# Patient Record
Sex: Male | Born: 1975 | Race: Black or African American | Hispanic: No | Marital: Single | Smoking: Never smoker
Health system: Southern US, Community
[De-identification: ages and names within clinical notes are randomized; demographics above are authoritative.]

## PROBLEM LIST (undated history)

## (undated) DIAGNOSIS — F70 Mild intellectual disabilities: Secondary | ICD-10-CM

## (undated) DIAGNOSIS — D72819 Decreased white blood cell count, unspecified: Secondary | ICD-10-CM

## (undated) DIAGNOSIS — H9190 Unspecified hearing loss, unspecified ear: Secondary | ICD-10-CM

## (undated) HISTORY — DX: Decreased white blood cell count, unspecified: D72.819

## (undated) HISTORY — DX: Mild intellectual disabilities: F70

## (undated) HISTORY — PX: OTHER SURGICAL HISTORY: SHX169

## (undated) HISTORY — DX: Unspecified hearing loss, unspecified ear: H91.90

## (undated) HISTORY — PX: TONSILLECTOMY: SUR1361

---

## 1999-02-28 ENCOUNTER — Emergency Department (HOSPITAL_COMMUNITY): Admission: EM | Admit: 1999-02-28 | Discharge: 1999-02-28 | Payer: Self-pay | Admitting: Emergency Medicine

## 1999-09-05 ENCOUNTER — Ambulatory Visit (HOSPITAL_BASED_OUTPATIENT_CLINIC_OR_DEPARTMENT_OTHER): Admission: RE | Admit: 1999-09-05 | Discharge: 1999-09-05 | Payer: Self-pay | Admitting: Dentistry

## 2002-11-22 ENCOUNTER — Encounter: Admission: RE | Admit: 2002-11-22 | Discharge: 2002-11-22 | Payer: Self-pay | Admitting: Family Medicine

## 2002-11-22 ENCOUNTER — Encounter: Payer: Self-pay | Admitting: Family Medicine

## 2010-05-09 ENCOUNTER — Ambulatory Visit
Admission: RE | Admit: 2010-05-09 | Discharge: 2010-05-09 | Payer: Self-pay | Source: Home / Self Care | Attending: Internal Medicine | Admitting: Internal Medicine

## 2010-05-09 DIAGNOSIS — F70 Mild intellectual disabilities: Secondary | ICD-10-CM

## 2010-05-09 HISTORY — DX: Mild intellectual disabilities: F70

## 2010-05-29 NOTE — Assessment & Plan Note (Signed)
Summary: NEW MEDICARE PT---PKG---#---STC   Vital Signs:  Patient profile:   35 year old male Height:      69 inches Weight:      210 pounds BMI:     31.12 O2 Sat:      97 % on Room air Temp:     97.4 degrees F oral Pulse rate:   85 / minute Pulse rhythm:   regular Resp:     16 per minute BP sitting:   100 / 60  (left arm) Cuff size:   large  Vitals Entered By: Rock Nephew CMA (May 09, 2010 4:19 PM)  Nutrition Counseling: Patient's BMI is greater than 25 and therefore counseled on weight management options.  O2 Flow:  Room air CC: New to establish Is Patient Diabetic? No Pain Assessment Patient in pain? no       Does patient need assistance? Functional Status Self care Ambulation Normal   Primary Care Provider:  Etta Grandchild MD  CC:  New to establish.  History of Present Illness: New to me this is a deaf male who needs forms completed so he can use public transportation with assistance. His father is with him today and tells me that he is unable to negotiate public places without assistance due to deafness and cognitive deficits that have been present since childhood. He was in special ed when he was in school.  Preventive Screening-Counseling & Management  Alcohol-Tobacco     Alcohol drinks/day: 0     Alcohol Counseling: not indicated; patient does not drink     Feels need to cut down: no     Smoking Status: never     Tobacco Counseling: not indicated; no tobacco use  Caffeine-Diet-Exercise     Does Patient Exercise: no  Hep-HIV-STD-Contraception     Hepatitis Risk: no risk noted     HIV Risk: no risk noted     STD Risk: no risk noted      Sexual History:  not active.        Drug Use:  no.    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    Physical Exam  General:  alert, well-developed, well-nourished, well-hydrated, appropriate dress, cooperative to examination, good hygiene, and overweight-appearing.   Head:  he has a prominent forehead and a very prominent chin with a  severe underbite. Eyes:  vision grossly intact, pupils equal, pupils round, and pupils reactive to light.   Ears:  R ear normal and L ear normal.   Nose:  External nasal examination shows no deformity or inflammation. Nasal mucosa are pink and moist without lesions or exudates. Mouth:  good dentition, pharynx pink and moist, no erythema, no exudates, no posterior lymphoid  hypertrophy, no postnasal drip, no pharyngeal crowing, and no erosions.   Neck:  supple, full ROM, no masses, no thyromegaly, no thyroid nodules or tenderness, no JVD, normal carotid upstroke, no carotid bruits, no cervical lymphadenopathy, and no neck tenderness.   Lungs:  normal respiratory effort, no intercostal retractions, no accessory muscle use, normal breath sounds, no dullness, no fremitus, no crackles, and no wheezes.   Heart:  normal rate, regular rhythm, no murmur, no gallop, no rub, and no JVD.   Abdomen:  soft, non-tender, normal bowel sounds, no distention, no masses, no guarding, no rigidity, no rebound tenderness, no  abdominal hernia, no inguinal hernia, no hepatomegaly, and no splenomegaly.   Msk:  normal ROM, no joint tenderness, no joint swelling, no joint warmth, no redness over joints, no joint deformities, no joint instability, and no crepitation.   Pulses:  R and L carotid,radial,femoral,dorsalis pedis and posterior tibial pulses are full and equal bilaterally Extremities:  No clubbing, cyanosis, edema, or deformity noted with normal full range of motion of all joints.   Neurologic:  No cranial nerve deficits noted. Station and gait are normal. Plantar reflexes are down-going bilaterally. DTRs are symmetrical throughout. Sensory, motor and coordinative functions appear intact. Skin:  Intact without suspicious lesions or rashes Cervical Nodes:  no anterior cervical adenopathy and no posterior cervical adenopathy.   Axillary Nodes:  no R axillary adenopathy and no L axillary adenopathy.   Inguinal Nodes:  no R inguinal adenopathy and no L inguinal adenopathy.   Psych:  normally interactive, good eye contact, not anxious appearing, and not depressed appearing.     Impression & Recommendations:  Problem # 1:  MENTAL RETARDATION, MILD (ICD-317) Assessment New forms were completed as requested  Patient Instructions: 1)  Please schedule a follow-up appointment in 4 months. 2)  It is important that you exercise regularly at least 20 minutes 5 times a week. If you develop chest pain, have severe difficulty breathing, or feel very tired , stop exercising immediately and seek medical attention. 3)  You need to lose weight. Consider a lower calorie diet and regular exercise.    Orders Added: 1)  New Patient Level III [99203]  Appended Document: NEW MEDICARE PT---PKG---#---STC    Clinical Lists Changes  Orders: Added new Service order of Tdap => 63yrs IM ([address]) - Signed Added new Service order of Admin 1st Vaccine (60454) - Signed Observations: Added new observation of TD BOOST VIS: 03/14/08  version given May 12, 2010. (05/12/2010 8:57) Added new observation of TD BOOSTERLO: [id number] (05/12/2010 8:57) Added new observation of TD BOOST EXP: 02/14/2012 (05/12/2010 8:57) Added new observation of TD BOOSTERBY: Lakisha Archie CMA (05/12/2010 8:57) Added new observation of TD BOOSTERRT: IM (05/12/2010 8:57) Added new observation of TDBOOSTERDSE: 0.5 ml (05/12/2010 8:57) Added new observation of TD BOOSTERMF: GlaxoSmithKline (05/12/2010 8:57) Added new observation of TD BOOST SIT: right deltoid (05/12/2010 8:57) Added new observation of TD BOOSTER: Tdap (05/12/2010 8:57)       Immunizations Administered:  Tetanus Vaccine:    Vaccine Type: Tdap    Site: right deltoid    Mfr: GlaxoSmithKline    Dose: 0.5 ml    Route: IM    Given by: Rock Nephew CMA    Exp. Date: 02/14/2012    Lot #: [id number]    VIS given: 03/14/08 version given May 12, 2010.

## 2010-06-19 ENCOUNTER — Encounter: Payer: Self-pay | Admitting: Internal Medicine

## 2010-06-19 ENCOUNTER — Telehealth: Payer: Self-pay | Admitting: Internal Medicine

## 2010-06-24 NOTE — Letter (Signed)
Summary: Generic Letter  [facility]  [address] [place], [place] [address]   Phone: [phone]  Fax: [phone]       06/19/2010  JOVANNIE ULIBARRI [address] [place], [place]  [address]  Dear Mr. GODEK,   Please excuse the above individual from jury duty due to deafness and cognitive deficits.        Sincerely,   Sanda Linger MD

## 2010-06-24 NOTE — Progress Notes (Signed)
Summary: REQ FOR LETTER  Phone Note Call from Patient Call back at Home Phone [phone]   Summary of Call: Pt's father called req a letter from MD to relieve pt from judy duty.  Initial call taken by: Lamar Sprinkles, CMA,  June 19, 2010 2:20 PM  Follow-up for Phone Call        Letter ready, need to call father to inform.  Follow-up by: Lamar Sprinkles, CMA,  June 19, 2010 4:40 PM  Additional Follow-up for Phone Call Additional follow up Details #1::        mother informed Additional Follow-up by: Lamar Sprinkles, CMA,  June 19, 2010 5:41 PM

## 2010-09-12 NOTE — Op Note (Signed)
[place]. [facility]  Patient:    Eugene Mccoy, Eugene Mccoy                      MRN: [id number] Adm. Date:  [id number] Disc. Date: [id number] Attending:  Juluis Pitch Hincken                           Operative Report  Plumer was brought into the operating room in the supine position.  General anesthesia induced and nasal intubation used without complications.  Local anesthesia administered was 108 mg Carbocaine with 0.270 mg ______.  Full mouth debridement performed with Cavitron and hand instruments.  Teeth #14, 15, 19, 30, and 31 deemed to be hopeless due to bone loss and mobility and were removed with forceps without complications.  A total of six 4-0 chromic gut sutures were used to close the five extraction sites.  No overall complications.  Postoperative instructions, oral and written, were given to the patients mother.  Postoperative care to be handled in Dr. Allayne Gitelman. [place] office in two to three weeks. DD:  09/05/99 TD:  09/08/99 Job: [id number]

## 2010-11-06 ENCOUNTER — Encounter: Payer: Self-pay | Admitting: Internal Medicine

## 2010-11-07 ENCOUNTER — Ambulatory Visit: Payer: Self-pay | Admitting: Internal Medicine

## 2010-11-07 ENCOUNTER — Encounter: Payer: Self-pay | Admitting: Internal Medicine

## 2010-11-07 ENCOUNTER — Ambulatory Visit (INDEPENDENT_AMBULATORY_CARE_PROVIDER_SITE_OTHER): Payer: Medicare Other | Admitting: Internal Medicine

## 2010-11-07 ENCOUNTER — Other Ambulatory Visit (INDEPENDENT_AMBULATORY_CARE_PROVIDER_SITE_OTHER): Payer: Medicare Other

## 2010-11-07 DIAGNOSIS — Z0000 Encounter for general adult medical examination without abnormal findings: Secondary | ICD-10-CM

## 2010-11-07 DIAGNOSIS — H9190 Unspecified hearing loss, unspecified ear: Secondary | ICD-10-CM

## 2010-11-07 DIAGNOSIS — D61818 Other pancytopenia: Secondary | ICD-10-CM

## 2010-11-07 LAB — COMPREHENSIVE METABOLIC PANEL
ALT: 35 U/L (ref 0–53)
AST: 26 U/L (ref 0–37)
Albumin: 4.7 g/dL (ref 3.5–5.2)
Alkaline Phosphatase: 59 U/L (ref 39–117)
GFR: 109.43 mL/min (ref >60.00)
Glucose, Bld: 90 mg/dL (ref 70–99)
Potassium: 3.8 mEq/L (ref 3.5–5.1)

## 2010-11-07 LAB — LIPID PANEL
HDL: 53.8 mg/dL (ref >39.00)
Triglycerides: 96 mg/dL (ref 0.0–149.0)
VLDL: 19.2 mg/dL (ref 0.0–40.0)

## 2010-11-07 LAB — CBC WITH DIFFERENTIAL/PLATELET
Basophils Absolute: 0 10*3/uL (ref 0.0–0.1)
Basophils Relative: 0.4 % (ref 0.0–3.0)
Eosinophils Absolute: 0 10*3/uL (ref 0.0–0.7)
HCT: 35.8 % — ABNORMAL LOW (ref 39.0–52.0)
Hemoglobin: 12.8 g/dL — ABNORMAL LOW (ref 13.0–17.0)
Lymphs Abs: 1.3 10*3/uL (ref 0.7–4.0)
MCV: 103.3 fl — ABNORMAL HIGH (ref 78.0–100.0)
Monocytes Relative: 9.1 % (ref 3.0–12.0)
Neutro Abs: 0.6 10*3/uL — ABNORMAL LOW (ref 1.4–7.7)
Neutrophils Relative %: 27 % — ABNORMAL LOW (ref 43.0–77.0)
RBC: 3.46 Mil/uL — ABNORMAL LOW (ref 4.22–5.81)
RDW: 14 % (ref 11.5–14.6)
WBC: 2.1 10*3/uL — ABNORMAL LOW (ref 4.5–10.5)

## 2010-11-07 NOTE — Patient Instructions (Signed)
Health Maintenance in Males MAINTAIN REGULAR HEALTH EXAMS  Maintain a healthy diet and normal weight. Increased weight leads to problems with blood pressure and diabetes. Decrease fat in the diet and increase exercise. Obtain a proper diet from your caregiver if necessary.   High blood pressure causes heart and blood vessel problems. Check blood pressures regularly and keep your blood pressure at normal limits. Aerobic exercise helps this. Persistent elevations of blood pressure should be treated with medications if weight loss and exercise are ineffective.   Avoid smoking, drinking in excess (more than 2 drinks per day), or use of street drugs. Do not share needles with anyone. Ask for help if you need assistance or instructions on stopping the use of alcohol, cigarettes, or drugs.   Maintain normal blood lipids and cholesterol. Your caregiver can give you information to lower your risk of heart disease or stroke.   Ask your caregiver if you are in need of early heart disease screening because of a strong family history of heart disease or signs of elevated testosterone (male sex hormone) levels. These can predispose you to early heart disease.   Practice safe sex. Practicing safe sex decreases your risk for a sexually transmitted infection (STI). Some of the STIs are gonorrhea, chlamydia, syphilis, trichimonas, herpes, human papillomavirus (HPV), and human immunodeficiency virus (HIV). Herpes, HIV, and HPV are viral illnesses that have no cure. These can result in disability, cancer, and death.   It is not safe for someone who has AIDS or is HIV positive to have unprotected sex with a partner who is HIV positive. The reason for this is the fact that there are many different strains of HIV. If you have a strain that is readily treated with medications and then suddenly introduce a strain from a partner that has no further treatment options, you may suddenly have a strain of HIV that is untreatable.  Even if you are both positive for HIV, it is still necessary to practice safe sex.   Use sunscreen with a SPF of 15 or greater. Being outside in the sun when your shadow caused by the sun is shorter than you are, means you are being exposed to sun at greater intensity. Lighter skinned people are at a greater risk of skin cancer.   Keep carbon monoxide and smoke detectors in your home and functioning at all times. Change the batteries every 6 months.   Do monthly examinations of your testicles. The best time to do this is after a hot shower or bath when the tissues are loose. Notify your caregivers of any lumps, tenderness, or changes in size or shape.   Notify your caregiver of new moles or changes in moles, especially if there is a change in shape or color. Also notify your caregiver if a mole is larger than the size of a pencil eraser.   Stay current with your tetanus shots and other required immunizations.  The Body Mass Index (BMI) is a way of measuring how much of your body is fat. Having a BMI above 27 increases the risk of heart disease, diabetes, hypertension, stroke, and other problems related to obesity. Document Released: 10/10/2007 Document Re-Released: 10/01/2009 Exit[employer] Patient Information 2011 [employer]. 

## 2010-11-07 NOTE — Progress Notes (Signed)
  Subjective:    Patient ID: Eugene Mccoy, male    DOB: 1975-05-06, 35 y.o.   MRN: [id number]  HPI He returns for a complete physical with his father today. His father thinks that his hearing is worsening, a ROS is provided by the pt's father due to his MR and very limited speech.   Review of Systems  Constitutional: Negative.   HENT: Positive for hearing loss. Negative for ear pain, nosebleeds, congestion, sore throat, facial swelling, rhinorrhea, trouble swallowing, neck pain, neck stiffness, voice change, tinnitus and ear discharge.   Eyes: Negative.   Respiratory: Negative.   Cardiovascular: Negative.   Gastrointestinal: Negative.   Genitourinary: Negative.   Musculoskeletal: Negative.   Skin: Negative.   Neurological: Negative.   Hematological: Negative.        Objective:   Physical Exam  Vitals reviewed. Constitutional: He appears well-developed and well-nourished. No distress.  HENT:  Head: Normocephalic and atraumatic.  Right Ear: External ear normal.  Left Ear: External ear normal.  Nose: Nose normal.  Mouth/Throat: Oropharynx is clear and moist. No oropharyngeal exudate.  Eyes: Conjunctivae and EOM are normal. Pupils are equal, round, and reactive to light. Right eye exhibits no discharge. Left eye exhibits no discharge. No scleral icterus.  Neck: Normal range of motion. Neck supple. No JVD present. No tracheal deviation present. No thyromegaly present.  Cardiovascular: Normal rate, regular rhythm, normal heart sounds and intact distal pulses.  Exam reveals no gallop and no friction rub.   No murmur heard. Pulmonary/Chest: Effort normal and breath sounds normal. No stridor. No respiratory distress. He has no wheezes. He has no rales. He exhibits no tenderness.  Abdominal: Soft. Bowel sounds are normal. He exhibits no distension and no mass. There is no tenderness. There is no rebound and no guarding. Hernia confirmed negative in the right inguinal area and confirmed  negative in the left inguinal area.  Genitourinary: Testes normal and penis normal.    Right testis shows no mass, no swelling and no tenderness. Right testis is descended. Left testis shows no mass, no swelling and no tenderness. Left testis is descended. Circumcised. No penile erythema or penile tenderness. No discharge found.       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 He exhibits no edema and no tenderness.  Lymphadenopathy:    He has no cervical adenopathy.       Right: No inguinal adenopathy present.       Left: No inguinal adenopathy present.  Neurological: He is alert. He has normal reflexes. He displays normal reflexes. No cranial nerve deficit. He exhibits normal muscle tone. Coordination normal.  Skin: Skin is warm and dry. No rash noted. He is not diaphoretic. No erythema. No pallor.  Psychiatric: He has a normal mood and affect.          Assessment & Plan:

## 2010-11-09 ENCOUNTER — Encounter: Payer: Self-pay | Admitting: Internal Medicine

## 2010-11-09 NOTE — Assessment & Plan Note (Signed)
His CBC today is abnormal with low numbers across the board, I have asked him to see hematology

## 2010-11-09 NOTE — Assessment & Plan Note (Signed)
I did a referral for hearing evaluation

## 2010-11-09 NOTE — Assessment & Plan Note (Addendum)
The patient is here for annual Medicare wellness examination and management of other chronic and acute problems.   The risk factors are reflected in the social history.  The roster of all physicians providing medical care to patient - is listed in the Snapshot section of the chart.  Activities of daily living:  The patient is 100% inedpendent in all ADLs: dressing, toileting, feeding as well as independent mobility  Home safety : The patient has smoke detectors in the home. They wear seatbelts.No firearms at home ( firearms are present in the home, kept in a safe fashion). There is no violence in the home.   There is no risks for hepatitis, STDs or HIV. There is no   history of blood transfusion. They have no travel history to infectious disease endemic areas of the world.  The patient has (has not) seen their dentist in the last six month. They have (not) seen their eye doctor in the last year. They deny (admit to) any hearing difficulty and have not had audiologic testing in the last year.  They do not  have excessive sun exposure. Discussed the need for sun protection: hats, long sleeves and use of sunscreen if there is significant sun exposure.   Diet: the importance of a healthy diet is discussed. They do have a healthy (unhealthy-high fat/fast food) diet.  The patient has a regular exercise program: walking , 20 minutes duration,  4 times per week.  The benefits of regular aerobic exercise were discussed.  Depression screen: there are no signs or vegative symptoms of depression- irritability, change in appetite, anhedonia, sadness/tearfullness.  Cognitive assessment: the patient manages all their financial and personal affairs and is actively engaged. They could relate day,date,year and events; recalled 3/3 objects at 3 minutes; performed clock-face test normally.  The following portions of the patient's history were reviewed and updated as appropriate: allergies, current medications,  past family history, past medical history,  past surgical history, past social history  and problem list.  Vision, hearing, body mass index were assessed and reviewed.   During the course of the visit the patient was educated and counseled about appropriate screening and preventive services including : fall prevention , diabetes screening, nutrition counseling, colorectal cancer screening, and recommended immunizations.  

## 2010-11-27 ENCOUNTER — Ambulatory Visit (INDEPENDENT_AMBULATORY_CARE_PROVIDER_SITE_OTHER): Payer: Medicare Other | Admitting: Internal Medicine

## 2010-11-27 ENCOUNTER — Encounter: Payer: Self-pay | Admitting: Internal Medicine

## 2010-11-27 ENCOUNTER — Ambulatory Visit: Payer: Medicare Other

## 2010-11-27 DIAGNOSIS — D61818 Other pancytopenia: Secondary | ICD-10-CM

## 2010-11-27 LAB — CBC WITH DIFFERENTIAL/PLATELET
Basophils Absolute: 0 10*3/uL (ref 0.0–0.1)
Eosinophils Absolute: 0 10*3/uL (ref 0.0–0.7)
Eosinophils Relative: 1.7 % (ref 0.0–5.0)
Lymphs Abs: 1.2 10*3/uL (ref 0.7–4.0)
MCV: 103.6 fl — ABNORMAL HIGH (ref 78.0–100.0)
Monocytes Absolute: 0.2 10*3/uL (ref 0.1–1.0)
Neutrophils Relative %: 33.7 % — ABNORMAL LOW (ref 43.0–77.0)
Platelets: 146 10*3/uL — ABNORMAL LOW (ref 150.0–400.0)
RDW: 13.4 % (ref 11.5–14.6)
WBC: 2.1 10*3/uL — ABNORMAL LOW (ref 4.5–10.5)

## 2010-11-27 LAB — VITAMIN B12: Vitamin B-12: 912 pg/mL — ABNORMAL HIGH (ref 211–911)

## 2010-11-27 LAB — LACTATE DEHYDROGENASE: LDH: 193 U/L (ref 94–250)

## 2010-11-27 LAB — HIV ANTIBODY (ROUTINE TESTING W REFLEX): HIV: NONREACTIVE

## 2010-11-27 LAB — SEDIMENTATION RATE: Sed Rate: 22 mm/hr (ref 0–22)

## 2010-11-27 LAB — FOLATE: Folate: 6.4 ng/mL (ref >5.9)

## 2010-11-27 NOTE — Patient Instructions (Signed)
Aplastic Anemia Aplastic anemia occurs when the soft material that makes up the hollow insides of your bones (bone marrow) stops making enough blood cells. These cells are:  Red cells to carry oxygen.   White cells to fight infection.   Platelets to help your blood clot when you have an injury.  Your bone marrow continually makes new blood cells because they do not last very long. Red blood cells live about four months. Platelets last only one week and white blood cells last about a day. Anything that hurts or injures your bone marrow can cause aplastic anemia. Aplastic anemia affects all age groups although it seems to appear a little more frequently in children. For unknown reasons, it also appears in people aged 20-25 and those over age 60. It usually develops slowly. In a few cases, symptoms can develop very quickly. There are many possible causes. Even after aggressive treatment, as many as 1 in 4 patients can die within the first year. Many are treated successfully but must be monitored for possible recurrent problems. Aplastic anemia is a rare and serious condition.  CAUSES Some things that injure marrow can include:  Radiation and chemotherapy treatment for cancer. These treatments used to kill cancer cells also damage other cells.   Exposure to toxic chemicals used in some pesticides and insecticides may damage marrow.   Some medications, such as those used to treat rheumatoid arthritis and some antibiotics, can cause secondary aplastic anemia.   Autoimmune disorders in which your immune system begins attacking your own body cells.   Viral infections can affect bone marrow.   Pregnancy   Idiopathic aplastic anemia makes up about half the cases of aplastic anemia. This means the cause is unknown.  SYMPTOMS Red blood cells carry oxygen. A decrease in red blood cells will make you short of breath. White blood cells fight infection. Decreases in white blood cells make you more likely  to get an infection. Platelets help your blood clot. Too few platelets can cause bleeding.  Other common signs and symptoms include:  Fatigue.  Light headedness or fainting.   Shortness of breath and rapid heart rate with exertion.   Pale skin and lips.   Frequent infections.  Easy bruising and bleeding.   Nosebleeds and bleeding gums.   Prolonged bleeding from cuts.   Sore mouth.   DIAGNOSIS  Blood tests and a bone marrow biopsy usually are used to find out what is wrong.   A number of different problems can make one of your blood cells low, but when they are all low, it is more worrisome.   Additional testing may be done to find the underlying cause for the anemia.  TREATMENT You will usually be referred to a specialist in blood diseases (hematologist) or to a treatment center which specializes in the treatment of aplastic anemia. Severe aplastic anemia is life-threatening. You will need to be hospitalized. Mild or moderate aplastic anemia is still serious, but you may be treated as an outpatient unless complications develop. Treatments may include:  Observation for mild cases.   Blood transfusions.   Medications. When anemia is from an autoimmune disorder, medications may be used to suppress the immune system. Medications are also available to stimulate marrow to make more blood cells.   Bone marrow transplantation. This is a procedure where healthy marrow from a donor is given to the person with aplastic anemia. This is used for severe aplastic anemia. If a donor is found, the marrow that   you have left is depleted with radiation. This is done so the remaining marrow does not try to fight against the healthy donor marrow. The healthy marrow is given into a vein and it goes to the bone marrow cavities where it will hopefully begin producing new blood cells.  This procedure carries risk. If the body rejects the transplant, it can be life-threatening. Not everyone is a candidate  for transplantation. It requires a lengthy stay in the hospital. After the transplant, drugs to help prevent rejection are necessary. One other risk is the danger of catching a disease from the donor. Today the blood supply is the safest it has ever been. The risk is extremely small but still remains. HOME CARE INSTRUCTIONS  Get plenty of rest and eat a well balanced diet.   Avoid excessive exercise. Long-term anemia can stress the heart.   When platelets are at low levels and the risk of bleeding is greater, avoid all activities that risk injury.   Protect yourself from infections by washing your hands often. Avoid crowds. Avoid being around sick people.  PREVENTION Especially if you have had this problem before, avoid exposure to insecticides, herbicides, organic solvents, paint removers and other toxic chemicals. SEEK IMMEDIATE MEDICAL CARE IF YOU DEVELOP:  A temperature above 100 F (37.8 C).   Flu-like feelings, signs of infection or more frequent infections.   Blood in your urine or bowel movements.   Easy bruising or bleeding from your gums or nose.   Prolonged bleeding from cuts.   Increasing shortness of breath or chest pain with exertion.   A rapid heart rate with exertion.   Increasing fatigue and tiredness.   Light headedness or fainting.   Pale skin and lips.   A sore mouth.  MAKE SURE YOU:   Understand these instructions.   Will watch your condition.   Will get help right away if you are not doing well or get worse.  Document Released: 02/08/2007 Document Re-Released: 07/08/2009 Exit[employer] Patient Information 2011 [employer]. 

## 2010-11-27 NOTE — Assessment & Plan Note (Signed)
Today I will repeat the CBC and start looking for secondary causes of bone marrow malfunction

## 2010-11-27 NOTE — Progress Notes (Signed)
  Subjective:    Patient ID: Eugene Mccoy, male    DOB: 12-09-1975, 35 y.o.   MRN: [id number]  Anemia Presents for follow-up visit. There has been no abdominal pain, anorexia, bruising/bleeding easily, confusion, fever, leg swelling, light-headedness, malaise/fatigue, pallor, palpitations, paresthesias, pica or weight loss. Signs of blood loss that are not present include hematemesis, hematochezia and melena.      Review of Systems  Constitutional: Negative.  Negative for fever, weight loss and malaise/fatigue.  HENT: Negative.   Eyes: Negative.   Respiratory: Negative.   Cardiovascular: Negative.  Negative for palpitations.  Gastrointestinal: Negative.  Negative for abdominal pain, melena, hematochezia, anorexia and hematemesis.  Genitourinary: Negative.   Musculoskeletal: Negative.   Skin: Negative.  Negative for pallor.  Neurological: Negative.  Negative for light-headedness and paresthesias.  Hematological: Negative.  Negative for adenopathy. Does not bruise/bleed easily.  Psychiatric/Behavioral: Negative.  Negative for confusion.       Objective:   Physical Exam  Vitals reviewed. Constitutional: He is oriented to person, place, and time. He appears well-developed and well-nourished. No distress.  HENT:  Head: Normocephalic and atraumatic.  Right Ear: External ear normal.  Left Ear: External ear normal.  Nose: Nose normal.  Mouth/Throat: Oropharynx is clear and moist. No oropharyngeal exudate.  Eyes: Conjunctivae and EOM are normal. Pupils are equal, round, and reactive to light. Right eye exhibits no discharge. Left eye exhibits no discharge. No scleral icterus.  Neck: Normal range of motion. Neck supple. No JVD present. No tracheal deviation present. No thyromegaly present.  Cardiovascular: Normal rate, regular rhythm, normal heart sounds and intact distal pulses.  Exam reveals no gallop and no friction rub.   No murmur heard. Pulmonary/Chest: Effort normal and breath  sounds normal. No stridor. No respiratory distress. He has no wheezes. He has no rales. He exhibits no tenderness.  Abdominal: Soft. Bowel sounds are normal. He exhibits no distension and no mass. There is no tenderness. There is no rebound and no guarding.  Musculoskeletal: Normal range of motion. He exhibits no edema and no tenderness.  Lymphadenopathy:    He has no cervical adenopathy.  Neurological: He is alert and oriented to person, place, and time. He has normal reflexes. He displays normal reflexes. No cranial nerve deficit. He exhibits normal muscle tone. Coordination normal.  Skin: Skin is warm and dry. No rash noted. He is not diaphoretic. No erythema. No pallor.  Psychiatric: He has a normal mood and affect. His behavior is normal. Judgment and thought content normal.          Assessment & Plan:

## 2010-11-28 LAB — C-REACTIVE PROTEIN: CRP: 0.9 mg/dL — ABNORMAL HIGH (ref <0.6)

## 2010-12-02 ENCOUNTER — Other Ambulatory Visit: Payer: Self-pay | Admitting: Oncology

## 2010-12-02 ENCOUNTER — Encounter (HOSPITAL_BASED_OUTPATIENT_CLINIC_OR_DEPARTMENT_OTHER): Payer: Medicare Other | Admitting: Oncology

## 2010-12-02 ENCOUNTER — Other Ambulatory Visit (HOSPITAL_COMMUNITY)
Admission: RE | Admit: 2010-12-02 | Discharge: 2010-12-02 | Disposition: A | Discharge status: Home / Self Care | Payer: Medicare Other | Source: Ambulatory Visit | Attending: Oncology | Admitting: Oncology

## 2010-12-02 DIAGNOSIS — D649 Anemia, unspecified: Secondary | ICD-10-CM

## 2010-12-02 DIAGNOSIS — D72819 Decreased white blood cell count, unspecified: Secondary | ICD-10-CM

## 2010-12-02 DIAGNOSIS — C8580 Other specified types of non-Hodgkin lymphoma, unspecified site: Secondary | ICD-10-CM

## 2010-12-02 LAB — CBC WITH DIFFERENTIAL/PLATELET
EOS%: 1.5 % (ref 0.0–7.0)
MCH: 35.7 pg — ABNORMAL HIGH (ref 27.2–33.4)
MCHC: 36.1 g/dL — ABNORMAL HIGH (ref 32.0–36.0)
MCV: 98.9 fL — ABNORMAL HIGH (ref 79.3–98.0)
MONO%: 7.7 % (ref 0.0–14.0)
RBC: 3.59 10*6/uL — ABNORMAL LOW (ref 4.20–5.82)
RDW: 13.6 % (ref 11.0–14.6)
nRBC: 0 % (ref 0–0)

## 2010-12-03 LAB — COMPREHENSIVE METABOLIC PANEL
Alkaline Phosphatase: 64 U/L (ref 39–117)
BUN: 16 mg/dL (ref 6–23)
Glucose, Bld: 97 mg/dL (ref 70–99)
Total Bilirubin: 0.4 mg/dL (ref 0.3–1.2)

## 2010-12-17 ENCOUNTER — Ambulatory Visit (HOSPITAL_COMMUNITY)
Admission: RE | Admit: 2010-12-17 | Discharge: 2010-12-17 | Disposition: A | Discharge status: Home / Self Care | Payer: Medicare Other | Source: Ambulatory Visit | Attending: Oncology | Admitting: Oncology

## 2010-12-17 DIAGNOSIS — D72829 Elevated white blood cell count, unspecified: Secondary | ICD-10-CM | POA: Insufficient documentation

## 2010-12-17 DIAGNOSIS — C8580 Other specified types of non-Hodgkin lymphoma, unspecified site: Secondary | ICD-10-CM

## 2010-12-17 MED ORDER — IOHEXOL 300 MG/ML  SOLN
100.0000 mL | Freq: Once | INTRAMUSCULAR | Status: AC | PRN
  Administered 2010-12-17: 100 mL via INTRAVENOUS

## 2010-12-23 ENCOUNTER — Other Ambulatory Visit: Payer: Self-pay | Admitting: Oncology

## 2010-12-23 ENCOUNTER — Encounter (HOSPITAL_BASED_OUTPATIENT_CLINIC_OR_DEPARTMENT_OTHER): Payer: Medicare Other | Admitting: Oncology

## 2010-12-23 DIAGNOSIS — D649 Anemia, unspecified: Secondary | ICD-10-CM

## 2010-12-23 DIAGNOSIS — D72819 Decreased white blood cell count, unspecified: Secondary | ICD-10-CM

## 2010-12-23 LAB — CBC WITH DIFFERENTIAL/PLATELET
BASO%: 0.8 % (ref 0.0–2.0)
EOS%: 3.4 % (ref 0.0–7.0)
HCT: 36.2 % — ABNORMAL LOW (ref 38.4–49.9)
LYMPH%: 54.6 % — ABNORMAL HIGH (ref 14.0–49.0)
MCH: 36.7 pg — ABNORMAL HIGH (ref 27.2–33.4)
MCHC: 36.2 g/dL — ABNORMAL HIGH (ref 32.0–36.0)
MONO%: 11.2 % (ref 0.0–14.0)
NEUT%: 30 % — ABNORMAL LOW (ref 39.0–75.0)
Platelets: 161 10*3/uL (ref 140–400)
RBC: 3.57 10*6/uL — ABNORMAL LOW (ref 4.20–5.82)

## 2010-12-23 LAB — CHCC SMEAR

## 2010-12-24 ENCOUNTER — Other Ambulatory Visit: Payer: Self-pay | Admitting: Oncology

## 2010-12-24 DIAGNOSIS — C8590 Non-Hodgkin lymphoma, unspecified, unspecified site: Secondary | ICD-10-CM

## 2010-12-25 ENCOUNTER — Other Ambulatory Visit: Payer: Self-pay | Admitting: Diagnostic Radiology

## 2010-12-25 ENCOUNTER — Ambulatory Visit (HOSPITAL_COMMUNITY)
Admission: RE | Admit: 2010-12-25 | Discharge: 2010-12-25 | Disposition: A | Discharge status: Home / Self Care | Payer: Medicare Other | Source: Ambulatory Visit | Attending: Oncology | Admitting: Oncology

## 2010-12-25 ENCOUNTER — Other Ambulatory Visit: Payer: Self-pay | Admitting: Oncology

## 2010-12-25 ENCOUNTER — Ambulatory Visit (HOSPITAL_COMMUNITY): Payer: Medicare Other

## 2010-12-25 DIAGNOSIS — C8590 Non-Hodgkin lymphoma, unspecified, unspecified site: Secondary | ICD-10-CM

## 2010-12-25 DIAGNOSIS — D61818 Other pancytopenia: Secondary | ICD-10-CM | POA: Insufficient documentation

## 2010-12-25 DIAGNOSIS — Z01812 Encounter for preprocedural laboratory examination: Secondary | ICD-10-CM | POA: Insufficient documentation

## 2010-12-25 LAB — CBC
MCH: 35.3 pg — ABNORMAL HIGH (ref 26.0–34.0)
MCHC: 36 g/dL (ref 30.0–36.0)
Platelets: 139 10*3/uL — ABNORMAL LOW (ref 150–400)
RDW: 13.6 % (ref 11.5–15.5)

## 2010-12-25 LAB — PROTIME-INR: Prothrombin Time: 13.7 seconds (ref 11.6–15.2)

## 2011-04-01 ENCOUNTER — Ambulatory Visit (INDEPENDENT_AMBULATORY_CARE_PROVIDER_SITE_OTHER): Payer: Medicare Other | Admitting: Internal Medicine

## 2011-04-01 ENCOUNTER — Encounter: Payer: Self-pay | Admitting: Internal Medicine

## 2011-04-01 VITALS — BP 104/64 | HR 72 | Temp 98.2°F | Resp 16 | Wt 210.0 lb

## 2011-04-01 DIAGNOSIS — J209 Acute bronchitis, unspecified: Secondary | ICD-10-CM | POA: Insufficient documentation

## 2011-04-01 MED ORDER — GUAIFENESIN-CODEINE 100-10 MG/5ML PO SYRP: 5.0000 mL | ORAL_SOLUTION | Freq: Three times a day (TID) | ORAL | Status: AC | PRN

## 2011-04-01 MED ORDER — AZITHROMYCIN 500 MG PO TABS: 500.0000 mg | ORAL_TABLET | Freq: Every day | ORAL | Status: AC

## 2011-04-01 NOTE — Progress Notes (Signed)
  Subjective:    Patient ID: Eugene Mccoy, male    DOB: 1975-11-30, 35 y.o.   MRN: [id number]  Cough This is a new problem. The current episode started yesterday. The problem has been gradually worsening. The problem occurs every few minutes. The cough is productive of purulent sputum. Associated symptoms include chills, postnasal drip, rhinorrhea and a sore throat. Pertinent negatives include no chest pain, ear congestion, ear pain, fever, headaches, heartburn, hemoptysis, myalgias, nasal congestion, rash, shortness of breath, sweats, weight loss or wheezing. He has tried nothing for the symptoms.      Review of Systems  Constitutional: Positive for chills. Negative for fever, weight loss, diaphoresis, activity change, appetite change, fatigue and unexpected weight change.  HENT: Positive for congestion, sore throat, rhinorrhea and postnasal drip. Negative for ear pain, trouble swallowing, voice change and sinus pressure.   Eyes: Negative.   Respiratory: Positive for cough. Negative for hemoptysis, shortness of breath, wheezing and stridor.   Cardiovascular: Negative for chest pain, palpitations and leg swelling.  Gastrointestinal: Negative for heartburn, nausea, vomiting, abdominal pain, diarrhea and constipation.  Genitourinary: Negative for dysuria, urgency, frequency, hematuria, flank pain, decreased urine volume, enuresis and difficulty urinating.  Musculoskeletal: Negative for myalgias, back pain, joint swelling, arthralgias and gait problem.  Skin: Negative for color change, pallor, rash and wound.  Neurological: Negative for dizziness, tremors, seizures, syncope, facial asymmetry, speech difficulty, weakness, light-headedness, numbness and headaches.  Hematological: Negative for adenopathy. Does not bruise/bleed easily.  Psychiatric/Behavioral: Negative.        Objective:   Physical Exam  Vitals reviewed. Constitutional: He is oriented to person, place, and time. He appears  well-developed and well-nourished. No distress.  HENT:  Head: Normocephalic and atraumatic.  Mouth/Throat: Oropharynx is clear and moist. No oropharyngeal exudate.  Eyes: Conjunctivae are normal. Right eye exhibits no discharge. Left eye exhibits no discharge. No scleral icterus.  Neck: Normal range of motion. Neck supple. No JVD present. No tracheal deviation present. No thyromegaly present.  Cardiovascular: Normal rate, regular rhythm, normal heart sounds and intact distal pulses.  Exam reveals no gallop and no friction rub.   No murmur heard. Pulmonary/Chest: Effort normal and breath sounds normal. No stridor. No respiratory distress. He has no wheezes. He has no rales. He exhibits no tenderness.  Abdominal: Soft. Bowel sounds are normal. He exhibits no distension and no mass. There is no tenderness. There is no rebound and no guarding.  Musculoskeletal: Normal range of motion. He exhibits no edema and no tenderness.  Lymphadenopathy:    He has no cervical adenopathy.  Neurological: He is oriented to person, place, and time.  Skin: Skin is warm and dry. No rash noted. He is not diaphoretic. No erythema. No pallor.  Psychiatric: He has a normal mood and affect. His behavior is normal. Judgment and thought content normal.          Assessment & Plan:

## 2011-04-01 NOTE — Patient Instructions (Signed)

## 2011-04-01 NOTE — Assessment & Plan Note (Signed)
Will start zpak for the infection and robitussin with codeine for the cough

## 2011-06-12 ENCOUNTER — Encounter: Payer: Self-pay | Admitting: *Deleted

## 2011-06-17 ENCOUNTER — Other Ambulatory Visit: Payer: Medicare Other | Admitting: Lab

## 2011-06-17 ENCOUNTER — Ambulatory Visit (HOSPITAL_BASED_OUTPATIENT_CLINIC_OR_DEPARTMENT_OTHER): Payer: Medicare Other | Admitting: Oncology

## 2011-06-17 DIAGNOSIS — D72819 Decreased white blood cell count, unspecified: Secondary | ICD-10-CM

## 2011-06-17 DIAGNOSIS — D61818 Other pancytopenia: Secondary | ICD-10-CM

## 2011-06-17 LAB — CBC WITH DIFFERENTIAL/PLATELET
BASO%: 0.6 % (ref 0.0–2.0)
HCT: 37.2 % — ABNORMAL LOW (ref 38.4–49.9)
LYMPH%: 65.9 % — ABNORMAL HIGH (ref 14.0–49.0)
MCH: 36.2 pg — ABNORMAL HIGH (ref 27.2–33.4)
MCHC: 35.4 g/dL (ref 32.0–36.0)
MCV: 102.4 fL — ABNORMAL HIGH (ref 79.3–98.0)
MONO#: 0.2 10*3/uL (ref 0.1–0.9)
MONO%: 8.8 % (ref 0.0–14.0)
NEUT%: 22.8 % — ABNORMAL LOW (ref 39.0–75.0)
Platelets: 126 10*3/uL — ABNORMAL LOW (ref 140–400)
WBC: 2.2 10*3/uL — ABNORMAL LOW (ref 4.0–10.3)

## 2011-06-17 NOTE — Progress Notes (Signed)
Hematology and Oncology Follow Up Visit  Eugene Mccoy [id number] [birth year] 35 y.o. 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 Work up has not shown any disease.   Interim History: Eugene Mccoy is a 36 year old African American gentleman with mild mental retardation whom I saw initially on 12/02/2010 with evaluation for lymphocytosis and neutropenia.  He is here to discuss the results of his workup.  He underwent CT scan of chest, abdomen and pelvis, was unremarkable.  He had also had blood flow cytometry which was also unremarkable. His bone marrow biopsy was also unremarkable.  With all of this, Eugene Mccoy has been asymptomatic.  He has not reported any fevers, has not reported any chills, has not reported any hospitalization and has not had any illnesses.  Overall, he has continued to be active and works in [employer].  Has not had any problems at this point.   Medications: I have reviewed the patient's current medications. No current outpatient prescriptions on file.  Allergies: No Known Allergies  Past Medical History, Surgical history, Social history, and Family History were reviewed and updated.  Review of Systems: Constitutional:  Negative for fever, chills, night sweats, anorexia, weight loss, pain. Cardiovascular: no chest pain or dyspnea on exertion Respiratory: no cough, shortness of breath, or wheezing Neurological: no TIA or stroke symptoms Dermatological: negative ENT: negative Skin: Negative. Gastrointestinal: no abdominal pain, change in bowel habits, or black or bloody stools Genito-Urinary: no dysuria, trouble voiding, or hematuria Hematological and Lymphatic: negative Breast: negative Musculoskeletal: negative Remaining ROS negative. Physical Exam: Blood pressure 121/75, pulse 76, temperature 97 F (36.1 C), temperature source Oral, height 5\' 9"  (1.753 m), weight 212  lb 12.8 oz (96.525 kg). ECOG:  General appearance: alert Head: Normocephalic, without obvious abnormality, atraumatic Neck: no adenopathy, no carotid bruit, no JVD, supple, symmetrical, trachea midline and thyroid not enlarged, symmetric, no tenderness/mass/nodules Lymph nodes: Cervical, supraclavicular, and axillary nodes normal. Heart:regular rate and rhythm, S1, S2 normal, no murmur, click, rub or gallop Lung:chest clear, no wheezing, rales, normal symmetric air entry Abdomin: soft, non-tender, without masses or organomegaly EXT:no erythema, induration, or nodules   Lab Results: Lab Results  Component Value Date   WBC 2.2* 06/17/2011   HGB 13.1 06/17/2011   HCT 37.2* 06/17/2011   MCV 102.4* 06/17/2011   PLT 126* 06/17/2011     Chemistry      Component Value Date/Time   NA 137 12/02/2010 1402   K 3.9 12/02/2010 1402   CL 102 12/02/2010 1402   CO2 24 12/02/2010 1402   BUN 16 12/02/2010 1402   CREATININE 1.07 12/02/2010 1402      Component Value Date/Time   CALCIUM 9.9 12/02/2010 1402   ALKPHOS 64 12/02/2010 1402   AST 20 12/02/2010 1402   ALT 19 12/02/2010 1402   BILITOT 0.4 12/02/2010 1402       Impression and Plan: 1. 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  The differential diagnosis again was reiterated to Eugene Mccoy.  His parents accompanied him today, and I felt that lymphoproliferative disorder, bone marrow disease, MDS, LGL leukemia are all possibilities at this point.  Bone marrow biopsy in 12/25/2011 did not show any evidence of that. The etiology could be due to reactive process or possible lymphoproliferative process that is rather chronic in nature.  Will continue to observe him and repeat bone marrow if he develops further  count decline.He dose not need growth factor support at this time.   2. Anemia.  His hemoglobin is actually normal.  It is up to 13 and really no abnormalities noted at this time. 3. Follow-up will be in 6  months.   [facility], MD 2/20/20139:15 AM

## 2011-06-18 ENCOUNTER — Telehealth: Payer: Self-pay | Admitting: Oncology

## 2011-06-18 NOTE — Telephone Encounter (Signed)
S/w pt's mother re appt for 8/20.

## 2011-12-10 ENCOUNTER — Telehealth: Payer: Self-pay | Admitting: Oncology

## 2011-12-10 NOTE — Telephone Encounter (Signed)
S/w pt's mom re new appt for 9/18. appt r/s from 8/20 due to FS vac.

## 2011-12-11 IMAGING — CT CT BIOPSY
1 of 5 series · 15 of 32 positions shown, 19 images · non-contrast
Comparison: none

CLINICAL HISTORY: 35-year-old with pancytopenia.  Evaluate for
lymphoma.

[Series 2: pelvis_hip 3.0 b60f st · axial · 0.60mm/px · z∈[-394,-278]mm · 15 of 45 slices shown, 19 images]
[im 4/45  soft-tissue]
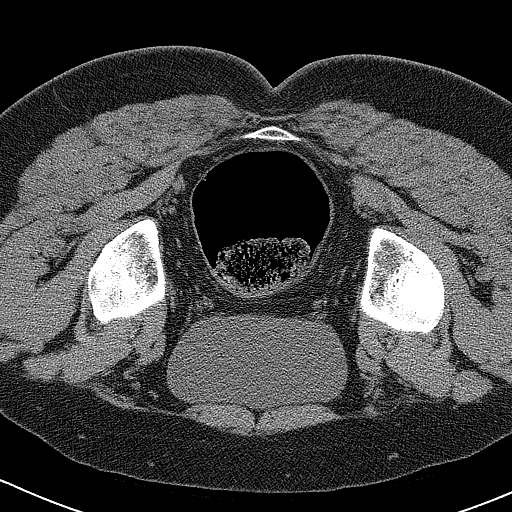
[im 4/45  bone]
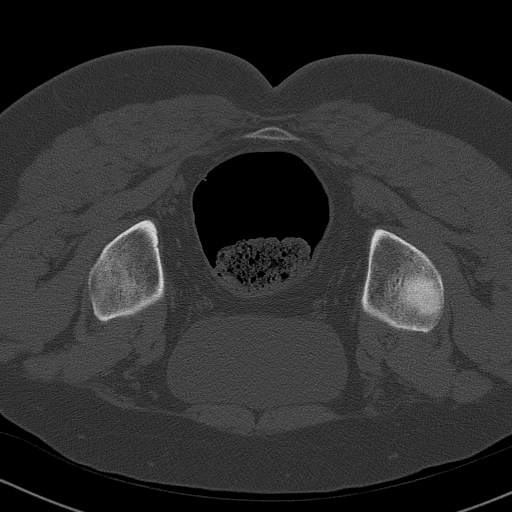
[im 7/45  soft-tissue]
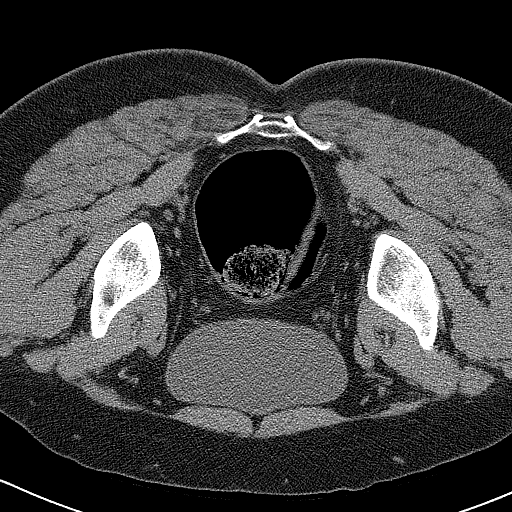
[im 10/45  soft-tissue]
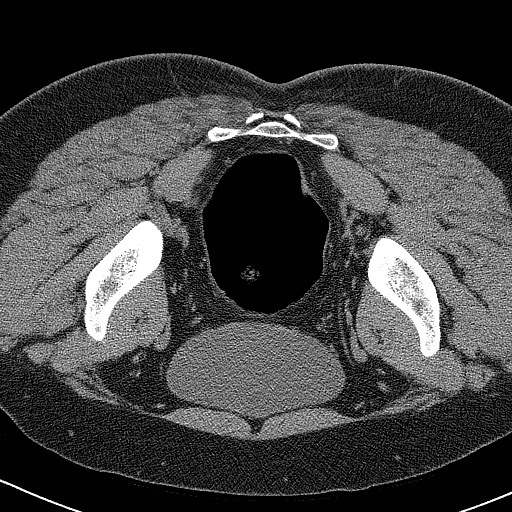
[im 14/45  soft-tissue]
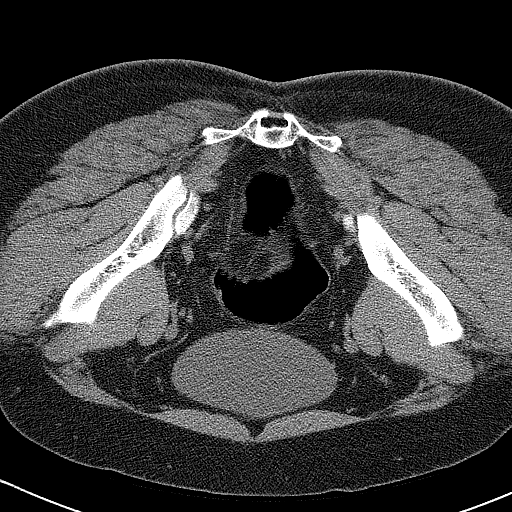
[im 17/45  soft-tissue]
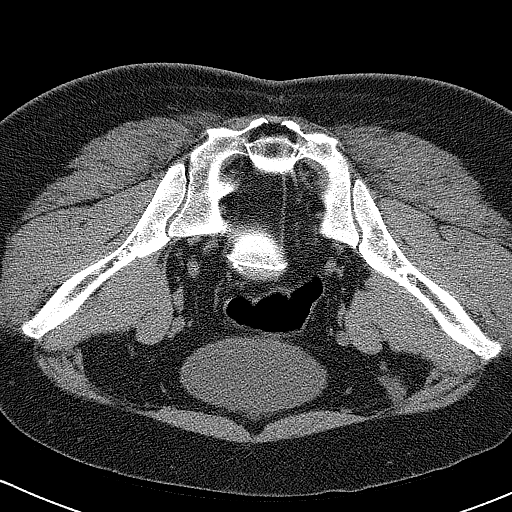
[im 20/45  soft-tissue]
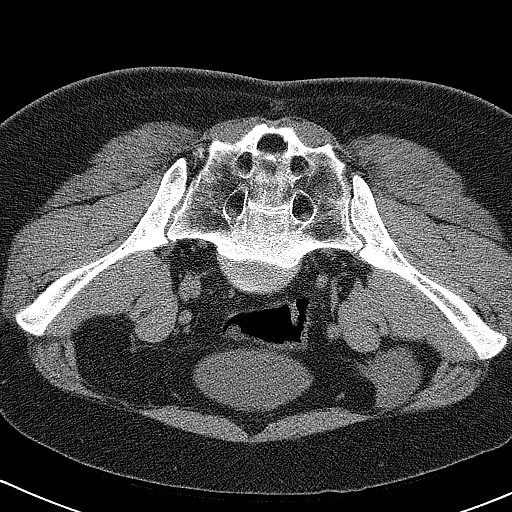
[im 23/45  soft-tissue]
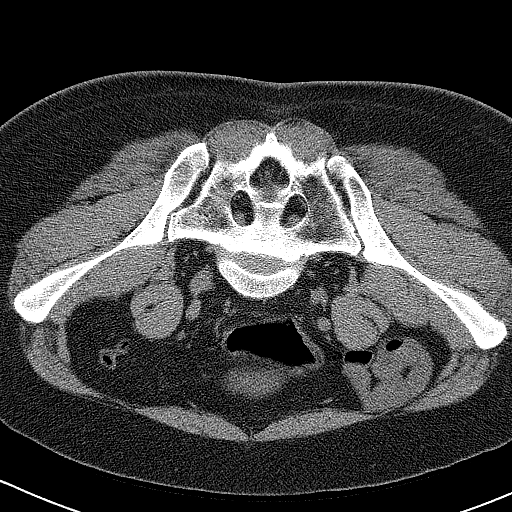
[im 27/45  soft-tissue]
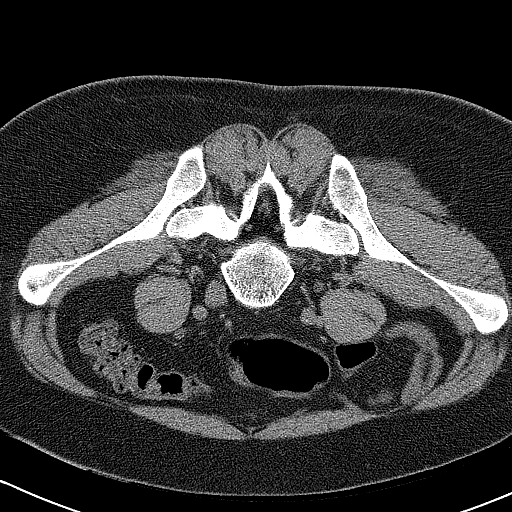
[im 30/45  soft-tissue]
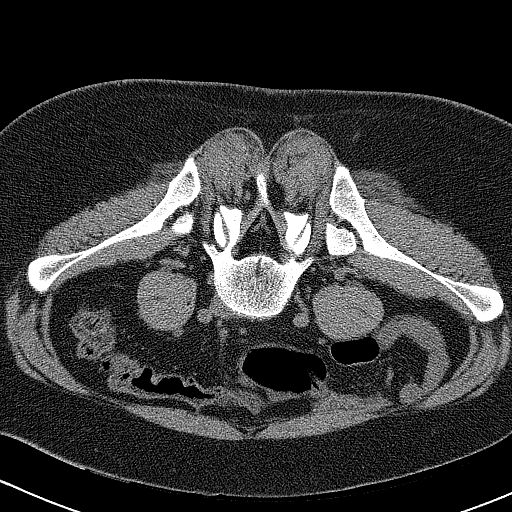
[im 30/45  bone]
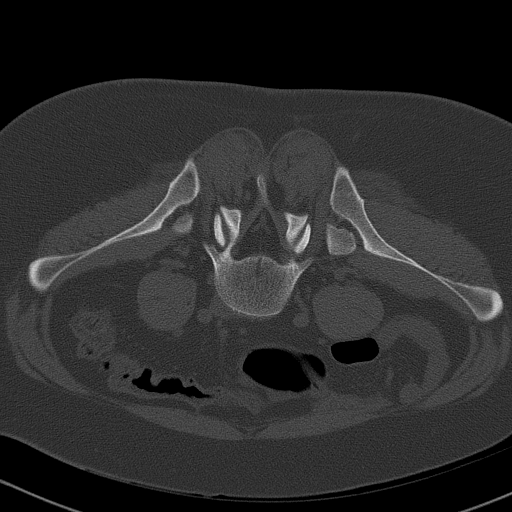
[im 33/45  soft-tissue]
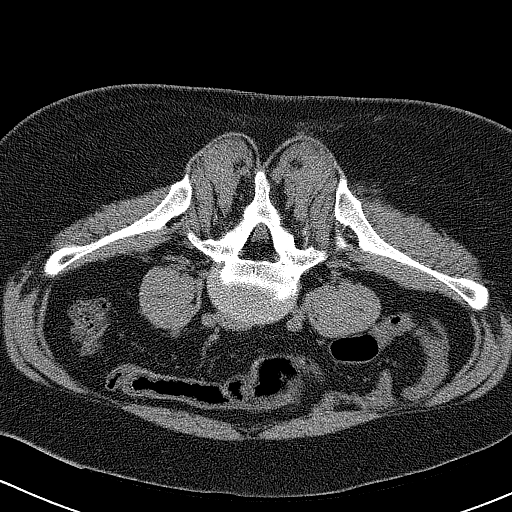
[im 36/45  soft-tissue]
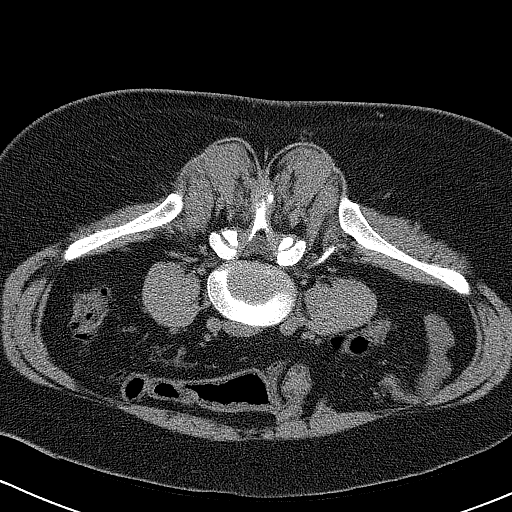
[im 38/45  lung]
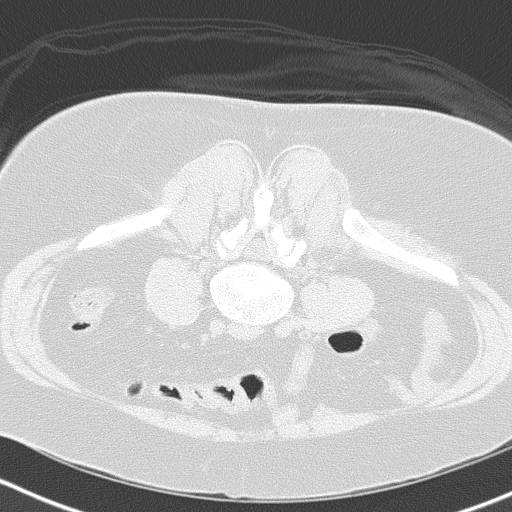
[im 40/45  soft-tissue]
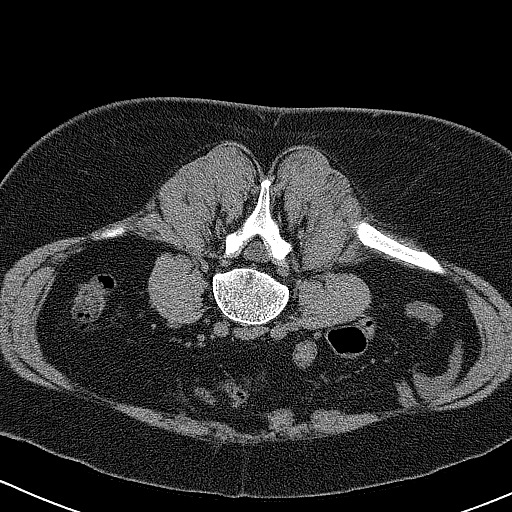
[im 40/45  lung]
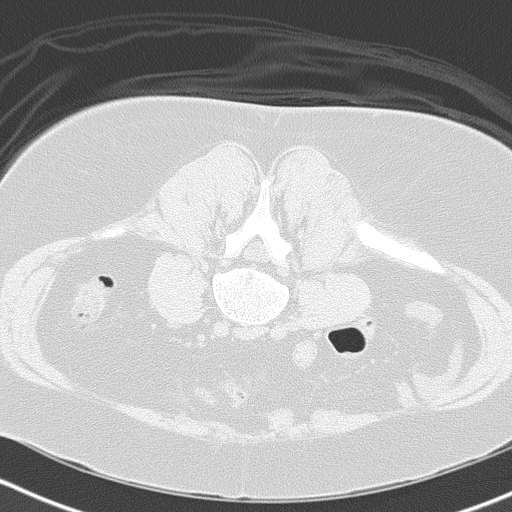
[im 41/45  lung]
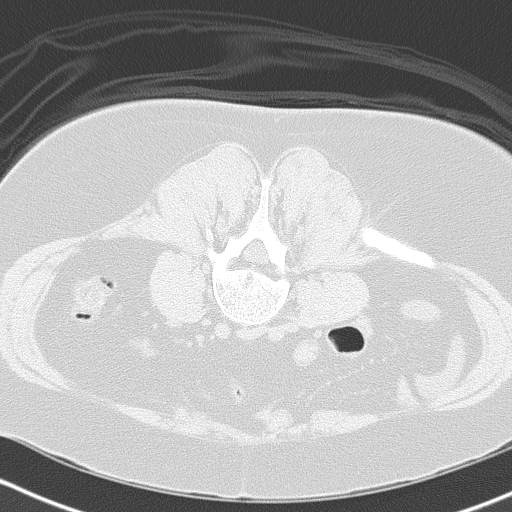
[im 43/45  soft-tissue]
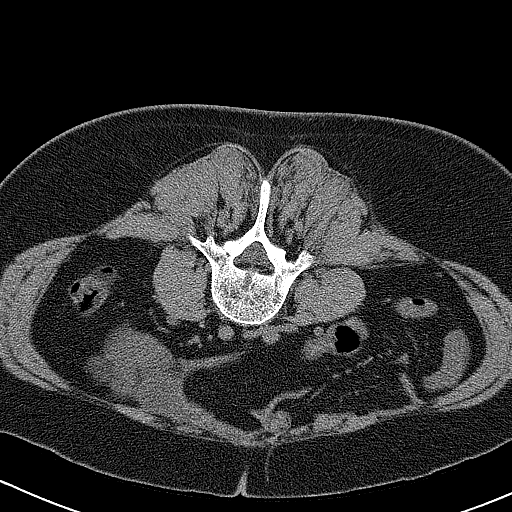
[im 43/45  lung]
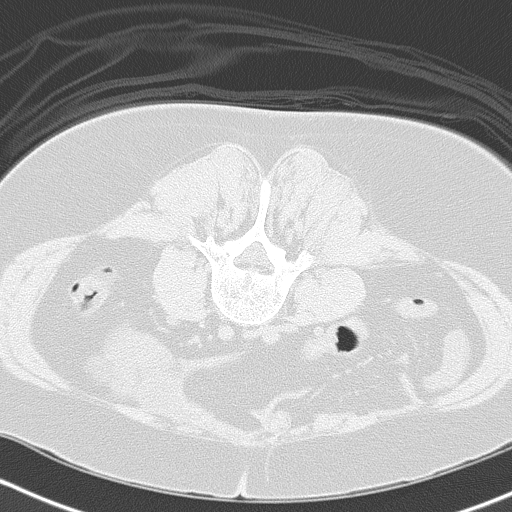

[15 of 32 positions shown; findings below may reference images not displayed]

PROCEDURE(S): CT GUIDED BONE MARROW BIOPSY AN ASPIRATION

Medications:Versed 2 mg, Fentanyl 50 mcg. A radiology nurse
monitored the patient for moderate sedation.

Moderate sedation time:12 minutes

Procedure:Informed consent was obtained for a bone marrow biopsy.
The patient was placed prone on the CT scanner.  Images of the
pelvis were obtained.  The middle of the back was prepped and
draped in a sterile fashion.  The skin and right iliac bone were
anesthetized with 1% lidocaine.  An 11 gauge bone needle was
directed to the right iliac bone with CT guidance.  Two aspirates
were obtained.  The needle was pulled out of the bone and directed
back into the bone and a single core biopsy was obtained.
FINDINGS: Needle placement in the posterior right iliac bone.

Complications: None
IMPRESSION: Successful CT guided bone marrow biopsy and aspiration.

## 2011-12-15 ENCOUNTER — Ambulatory Visit: Payer: Medicare Other | Admitting: Oncology

## 2011-12-15 ENCOUNTER — Other Ambulatory Visit: Payer: Medicare Other | Admitting: Lab

## 2012-01-13 ENCOUNTER — Ambulatory Visit (HOSPITAL_BASED_OUTPATIENT_CLINIC_OR_DEPARTMENT_OTHER): Payer: Medicare Other | Admitting: Oncology

## 2012-01-13 ENCOUNTER — Other Ambulatory Visit (HOSPITAL_BASED_OUTPATIENT_CLINIC_OR_DEPARTMENT_OTHER): Payer: Medicare Other | Admitting: Lab

## 2012-01-13 ENCOUNTER — Telehealth: Payer: Self-pay | Admitting: Oncology

## 2012-01-13 VITALS — BP 109/73 | HR 73 | Temp 97.0°F | Resp 20 | Ht 69.0 in | Wt 214.6 lb

## 2012-01-13 DIAGNOSIS — D72819 Decreased white blood cell count, unspecified: Secondary | ICD-10-CM

## 2012-01-13 DIAGNOSIS — D72820 Lymphocytosis (symptomatic): Secondary | ICD-10-CM

## 2012-01-13 DIAGNOSIS — D709 Neutropenia, unspecified: Secondary | ICD-10-CM

## 2012-01-13 DIAGNOSIS — D649 Anemia, unspecified: Secondary | ICD-10-CM

## 2012-01-13 DIAGNOSIS — D61818 Other pancytopenia: Secondary | ICD-10-CM

## 2012-01-13 LAB — CBC WITH DIFFERENTIAL/PLATELET
BASO%: 0.5 % (ref 0.0–2.0)
EOS%: 2.2 % (ref 0.0–7.0)
HCT: 37.5 % — ABNORMAL LOW (ref 38.4–49.9)
LYMPH%: 60.8 % — ABNORMAL HIGH (ref 14.0–49.0)
MCH: 37.2 pg — ABNORMAL HIGH (ref 27.2–33.4)
MCHC: 35.4 g/dL (ref 32.0–36.0)
NEUT%: 26.9 % — ABNORMAL LOW (ref 39.0–75.0)
RBC: 3.58 10*6/uL — ABNORMAL LOW (ref 4.20–5.82)
lymph#: 1.3 10*3/uL (ref 0.9–3.3)

## 2012-01-13 LAB — COMPREHENSIVE METABOLIC PANEL (CC13)
AST: 24 U/L (ref 5–34)
Alkaline Phosphatase: 65 U/L (ref 40–150)
BUN: 15 mg/dL (ref 7.0–26.0)
Creatinine: 1.1 mg/dL (ref 0.7–1.3)
Potassium: 3.9 mEq/L (ref 3.5–5.1)

## 2012-01-13 NOTE — Telephone Encounter (Signed)
S/w pt's dad re appt for 3.25.14. Per dad pt hearing impaired.

## 2012-01-13 NOTE — Progress Notes (Signed)
Hematology and Oncology Follow Up Visit  TOMA ARTS [id number] 1976-01-01 36 y.o. 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 Work up has not shown any disease. He is S/P bone marrow biopsy and CT scans on 11/2010. All are with normal limit.   Interim History: Mr. Millette is a 36 year old African American gentleman with mild mental retardation whom I saw initially on 12/02/2010 with evaluation for lymphocytosis and neutropenia.  He continued to do well since his last visit.  Mr. Baldi has been asymptomatic and continued to be.  He has not reported any fevers, has not reported any chills, has not reported any hospitalization and has not had any illnesses.  Overall, he has continued to be active and works in [employer].  Has not had any problems at this point.   Medications: I have reviewed the patient's current medications. No current outpatient prescriptions on file.  Allergies: No Known Allergies  Past Medical History, Surgical history, Social history, and Family History were reviewed and updated.  Review of Systems: Constitutional:  Negative for fever, chills, night sweats, anorexia, weight loss, pain. Cardiovascular: no chest pain or dyspnea on exertion Respiratory: no cough, shortness of breath, or wheezing Neurological: no TIA or stroke symptoms Dermatological: negative ENT: negative Skin: Negative. Gastrointestinal: no abdominal pain, change in bowel habits, or black or bloody stools Genito-Urinary: no dysuria, trouble voiding, or hematuria Hematological and Lymphatic: negative Breast: negative Musculoskeletal: negative Remaining ROS negative. Physical Exam: Blood pressure 109/73, pulse 73, temperature 97 F (36.1 C), temperature source Oral, resp. rate 20, height 5\' 9"  (1.753 m), weight 214 lb 9.6 oz (97.342 kg). ECOG:  General appearance: alert Head: Normocephalic, without  obvious abnormality, atraumatic Neck: no adenopathy, no carotid bruit, no JVD, supple, symmetrical, trachea midline and thyroid not enlarged, symmetric, no tenderness/mass/nodules Lymph nodes: Cervical, supraclavicular, and axillary nodes normal. Heart:regular rate and rhythm, S1, S2 normal, no murmur, click, rub or gallop Lung:chest clear, no wheezing, rales, normal symmetric air entry Abdomin: soft, non-tender, without masses or organomegaly EXT:no erythema, induration, or nodules   Lab Results: Lab Results  Component Value Date   WBC 2.1* 01/13/2012   HGB 13.3 01/13/2012   HCT 37.5* 01/13/2012   MCV 104.9* 01/13/2012   PLT 131* 01/13/2012     Chemistry      Component Value Date/Time   NA 137 12/02/2010 1402   K 3.9 12/02/2010 1402   CL 102 12/02/2010 1402   CO2 24 12/02/2010 1402   BUN 16 12/02/2010 1402   CREATININE 1.07 12/02/2010 1402      Component Value Date/Time   CALCIUM 9.9 12/02/2010 1402   ALKPHOS 64 12/02/2010 1402   AST 20 12/02/2010 1402   ALT 19 12/02/2010 1402   BILITOT 0.4 12/02/2010 1402       Impression and Plan: 1. 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  The differential diagnosis again was reiterated to Mr. Hidrogo.  His parents accompanied him today, and I felt that lymphoproliferative disorder, bone marrow disease, MDS, LGL leukemia are all possibilities at this point.  Bone marrow biopsy in 12/25/2011 did not show any evidence of that. The etiology could be due to reactive process or possible lymphoproliferative process that is rather chronic in nature.  Will continue to observe him and repeat bone marrow if he develops further count decline.He dose not need growth factor support at this time.  2. Anemia.  His hemoglobin is actually normal.  It is up to 13 and really no abnormalities noted at this time. 3. Follow-up will be in 6 months.   [facility], MD 9/18/201310:53 AM

## 2012-06-29 ENCOUNTER — Encounter: Payer: Self-pay | Admitting: Internal Medicine

## 2012-06-29 ENCOUNTER — Ambulatory Visit (INDEPENDENT_AMBULATORY_CARE_PROVIDER_SITE_OTHER): Payer: Medicare Other | Admitting: Internal Medicine

## 2012-06-29 VITALS — BP 114/72 | HR 89 | Temp 97.8°F | Ht 69.0 in | Wt 217.0 lb

## 2012-06-29 DIAGNOSIS — J069 Acute upper respiratory infection, unspecified: Secondary | ICD-10-CM

## 2012-06-29 DIAGNOSIS — H1132 Conjunctival hemorrhage, left eye: Secondary | ICD-10-CM

## 2012-06-29 DIAGNOSIS — H1130 Conjunctival hemorrhage, unspecified eye: Secondary | ICD-10-CM

## 2012-06-29 MED ORDER — AZITHROMYCIN 250 MG PO TABS: ORAL_TABLET | ORAL | Status: DC

## 2012-06-29 NOTE — Patient Instructions (Signed)
Subconjunctival Hemorrhage  A subconjunctival hemorrhage is a bright red patch covering a portion of the white of the eye. The white part of the eye is called the sclera, and it is covered by a thin membrane called the conjunctiva. This membrane is clear, except for tiny blood vessels that you can see with the naked eye. When your eye is irritated or inflamed and becomes red, it is because the vessels in the conjunctiva are swollen.  Sometimes, a blood vessel in the conjunctiva can break and bleed. When this occurs, the blood builds up between the conjunctiva and the sclera, and spreads out to create a red area. The red spot may be very small at first. It may then spread to cover a larger part of the surface of the eye, or even all of the visible white part of the eye.  In almost all cases, the blood will go away and the eye will become white again. Before completely dissolving, however, the red area may spread. It may also become brownish-yellow in color, before going away. If a lot of blood collects under the conjunctiva, it may look like a bulge on the surface of the eye. This looks scary, but it will also eventually flatten out and go away. Subconjunctival hemorrhages do not cause pain, but if swollen, may cause a feeling of irritation. There is no effect on vision.   CAUSES    The most common cause is mild trauma (rubbing the eye, irritation).   Subconjunctival hemorrhages can happen because of coughing or straining (lifting heavy objects), vomiting, or sneezing.   In some cases, your doctor may want to check your blood pressure. High blood pressure can also cause a sunconjunctival hemorrhage.   Severe trauma or blunt injuries.   Diseases that affect blood clotting (hemophilia, leukemia).   Abnormalities of blood vessels behind the eye (carotid cavernous sinus fistula).   Tumors behind the eye.   Certain drugs (aspirin, coumadin, heparin).   Recent eye surgery.  HOME CARE INSTRUCTIONS    Do not worry  about the appearance of your eye. You may continue your usual activities.   Often, follow-up is not necessary.  SEEK MEDICAL CARE IF:    Your eye becomes painful.   The bleeding does not disappear within 3 weeks.   Bleeding occurs elsewhere, for example, under the skin, in the mouth, or in the other eye.   You have recurring subconjunctival hemorrhages.  SEEK IMMEDIATE MEDICAL CARE IF:    Your vision changes or you have difficulty seeing.   You develop severe headache, persistent vomiting, confusion, or abnormal drowsiness (lethargy).   Your eye seems to bulge or protrude from the eye socket.   You notice the sudden appearance of bruises, or have spontaneous bleeding elsewhere on your body.  Document Released: 04/13/2005 Document Revised: 07/06/2011 Document Reviewed: 03/11/2009  [employer] Patient Information 2013 [employer].

## 2012-06-29 NOTE — Progress Notes (Signed)
HPI  Pt presents to the clinic today with c/o cold symptoms x 1 weeks. The worst part is the  dry cough. He does not produce any sputum. He was running fevers a few days ago. He has not taken anything OTC.Marland Kitchen He has no history of allergies or asthma. He has had sick contacts. Additionally, he c/o a problem with his left eye. The white part is pink. This started 2 days ago. It his not painful and he has not had any blurred vision. He has never had this happen before.  Review of Systems      Past Medical History  Diagnosis Date  . MENTAL RETARDATION, MILD 05/09/2010  . Deafness     Family History  Problem Relation Age of Onset  . Diabetes Other     1st degree relative    History   Social History  . Marital Status: Single    Spouse Name: N/A    Number of Children: N/A  . Years of Education: N/A   Occupational History  . Food services [employer]   Social History Main Topics  . Smoking status: Never Smoker   . Smokeless tobacco: Not on file  . Alcohol Use: No  . Drug Use: No  . Sexually Active: Not Currently   Other Topics Concern  . Not on file   Social History Narrative   Regular exercise-no             No Known Allergies   Constitutional: Positive headache, fatigue and fever. Denies abrupt weight changes.  HEENT:  Denies eye redness, eye pain, pressure behind the eyes, facial pain, nasal congestion, ear pain, ringing in the ears, wax buildup, runny nose or bloody nose. Respiratory: Positive cough. Denies difficulty breathing or shortness of breath.  Cardiovascular: Denies chest pain, chest tightness, palpitations or swelling in the hands or feet.   No other specific complaints in a complete review of systems (except as listed in HPI above).  Objective:   BP 114/72  Pulse 89  Temp(Src) 97.8 F (36.6 C) (Oral)  Ht 5\' 9"  (1.753 m)  Wt 217 lb (98.431 kg)  BMI 32.03 kg/m2  SpO2 97% Wt Readings from Last 3 Encounters:  06/29/12 217 lb (98.431 kg)   01/13/12 214 lb 9.6 oz (97.342 kg)  06/17/11 212 lb 12.8 oz (96.525 kg)     General: Appears his stated age, well developed, well nourished in NAD. HEENT: Head: normal shape and size; Eyes: sclera injected on left eye, no icterus, conjunctiva pink, PERRLA and EOMs intact; Ears: Tm's gray and intact, normal light reflex; Nose: mucosa pink and moist, septum midline; Throat/Mouth: + PND. Teeth present, mucosa erythematous and moist, no exudate noted, no lesions or ulcerations noted.  Neck: Mild cervical lymphadenopathy. Neck supple, trachea midline. No massses, lumps or thyromegaly present.  Cardiovascular: Normal rate and rhythm. S1,S2 noted.  No murmur, rubs or gallops noted. No JVD or BLE edema. No carotid bruits noted. Pulmonary/Chest: Normal effort and positive vesicular breath sounds. No respiratory distress. No wheezes, rales or ronchi noted.      Assessment & Plan:   Upper Respiratory Infection, new onset with additional workup required:  Get some rest and drink plenty of water Do salt water gargles for the sore throat eRx for Azithromax x 5 days  Subconjunctival hemorrage, left eye, new onset, no additional workup required:  Reassurance given that this will reabsrob  RTC as needed or if symptoms persist.

## 2012-07-18 ENCOUNTER — Telehealth: Payer: Self-pay | Admitting: Oncology

## 2012-07-18 NOTE — Telephone Encounter (Signed)
Pt's mother called and moved 3/25 appt to 5/2. Mother has new d/t.

## 2012-07-19 ENCOUNTER — Other Ambulatory Visit: Payer: Medicare Other | Admitting: Lab

## 2012-07-19 ENCOUNTER — Ambulatory Visit: Payer: Medicare Other | Admitting: Oncology

## 2012-08-26 ENCOUNTER — Ambulatory Visit (HOSPITAL_BASED_OUTPATIENT_CLINIC_OR_DEPARTMENT_OTHER): Payer: Medicare Other | Admitting: Oncology

## 2012-08-26 ENCOUNTER — Other Ambulatory Visit (HOSPITAL_BASED_OUTPATIENT_CLINIC_OR_DEPARTMENT_OTHER): Payer: Medicare Other | Admitting: Lab

## 2012-08-26 VITALS — BP 124/77 | HR 60 | Temp 97.9°F | Resp 18 | Ht 69.0 in | Wt 219.3 lb

## 2012-08-26 DIAGNOSIS — D61818 Other pancytopenia: Secondary | ICD-10-CM

## 2012-08-26 DIAGNOSIS — D72820 Lymphocytosis (symptomatic): Secondary | ICD-10-CM

## 2012-08-26 DIAGNOSIS — D709 Neutropenia, unspecified: Secondary | ICD-10-CM

## 2012-08-26 DIAGNOSIS — D649 Anemia, unspecified: Secondary | ICD-10-CM

## 2012-08-26 LAB — COMPREHENSIVE METABOLIC PANEL (CC13)
ALT: 43 U/L (ref 0–55)
AST: 37 U/L — ABNORMAL HIGH (ref 5–34)
Albumin: 4.1 g/dL (ref 3.5–5.0)
Alkaline Phosphatase: 62 U/L (ref 40–150)
BUN: 16.2 mg/dL (ref 7.0–26.0)
CO2: 22 meq/L (ref 22–29)
Calcium: 9.6 mg/dL (ref 8.4–10.4)
Chloride: 107 meq/L (ref 98–107)
Creatinine: 1.2 mg/dL (ref 0.7–1.3)
Glucose: 87 mg/dL (ref 70–99)
Potassium: 4 meq/L (ref 3.5–5.1)
Sodium: 141 meq/L (ref 136–145)
Total Bilirubin: 0.61 mg/dL (ref 0.20–1.20)
Total Protein: 7.4 g/dL (ref 6.4–8.3)

## 2012-08-26 LAB — CBC WITH DIFFERENTIAL/PLATELET
Basophils Absolute: 0 10*3/uL (ref 0.0–0.1)
EOS%: 2.3 % (ref 0.0–7.0)
HCT: 37.3 % — ABNORMAL LOW (ref 38.4–49.9)
HGB: 13.2 g/dL (ref 13.0–17.1)
MCH: 36.7 pg — ABNORMAL HIGH (ref 27.2–33.4)
MCV: 103.6 fL — ABNORMAL HIGH (ref 79.3–98.0)
MONO%: 10.5 % (ref 0.0–14.0)
NEUT%: 27.4 % — ABNORMAL LOW (ref 39.0–75.0)
Platelets: 125 10*3/uL — ABNORMAL LOW (ref 140–400)

## 2012-08-26 NOTE — Progress Notes (Signed)
Hematology and Oncology Follow Up Visit  Eugene Mccoy [id number] Mar 15, 1976 37 y.o. 08/26/2012 9:37 AM  CC: Sanda Linger, MD   Principle Diagnosis: This is a 37 year old gentleman with lymphocytosis diagnosed in 11/2010. Work up has not shown any disease. He is S/P bone marrow biopsy and CT scans on 11/2010. All are with normal limit.   Interim History: Eugene Mccoy is a 37 year old African American gentleman with mild mental retardation whom I saw initially on 12/02/2010 with evaluation for lymphocytosis and neutropenia.  He continued to do well since his last visit.  Eugene Mccoy has been asymptomatic and continued to be.  He has not reported any fevers, has not reported any chills, has not reported any hospitalization and has not had any illnesses.  Overall, he has continued to be active and works in [employer].  Has not had any problems at this point. No recent fevers or syncopal episodes.    Medications: I have reviewed the patient's current medications. No current outpatient prescriptions on file.  Allergies: No Known Allergies  Past Medical History, Surgical history, Social history, and Family History were reviewed and updated.  Review of Systems: Constitutional:  Negative for fever, chills, night sweats, anorexia, weight loss, pain. Cardiovascular: no chest pain or dyspnea on exertion Respiratory: no cough, shortness of breath, or wheezing Neurological: no TIA or stroke symptoms Dermatological: negative ENT: negative Skin: Negative. Gastrointestinal: no abdominal pain, change in bowel habits, or black or bloody stools Genito-Urinary: no dysuria, trouble voiding, or hematuria Hematological and Lymphatic: negative Breast: negative Musculoskeletal: negative Remaining ROS negative. Physical Exam: Blood pressure 124/77, pulse 60, temperature 97.9 F (36.6 C), temperature source Oral, resp. rate 18, height 5\' 9"  (1.753 m), weight 219 lb 4.8 oz (99.474 kg), SpO2 99.00%. ECOG:   General appearance: alert Head: Normocephalic, without obvious abnormality, atraumatic Neck: no adenopathy, no carotid bruit, no JVD, supple, symmetrical, trachea midline and thyroid not enlarged, symmetric, no tenderness/mass/nodules Lymph nodes: Cervical, supraclavicular, and axillary nodes normal. Heart:regular rate and rhythm, S1, S2 normal, no murmur, click, rub or gallop Lung:chest clear, no wheezing, rales, normal symmetric air entry Abdomin: soft, non-tender, without masses or organomegaly EXT:no erythema, induration, or nodules   Lab Results: Lab Results  Component Value Date   WBC 2.2* 08/26/2012   HGB 13.2 08/26/2012   HCT 37.3* 08/26/2012   MCV 103.6* 08/26/2012   PLT 125* 08/26/2012     Chemistry      Component Value Date/Time   NA 140 01/13/2012 1020   NA 137 12/02/2010 1402   K 3.9 01/13/2012 1020   K 3.9 12/02/2010 1402   CL 108* 01/13/2012 1020   CL 102 12/02/2010 1402   CO2 23 01/13/2012 1020   CO2 24 12/02/2010 1402   BUN 15.0 01/13/2012 1020   BUN 16 12/02/2010 1402   CREATININE 1.1 01/13/2012 1020   CREATININE 1.07 12/02/2010 1402      Component Value Date/Time   CALCIUM 10.0 01/13/2012 1020   CALCIUM 9.9 12/02/2010 1402   ALKPHOS 65 01/13/2012 1020   ALKPHOS 64 12/02/2010 1402   AST 24 01/13/2012 1020   AST 20 12/02/2010 1402   ALT 39 01/13/2012 1020   ALT 19 12/02/2010 1402   BILITOT 0.50 01/13/2012 1020   BILITOT 0.4 12/02/2010 1402       Impression and Plan: 1. This is a pleasant 37 year old gentleman with lymphocytosis and neutropenia.  The differential diagnosis again was reiterated to Eugene Mccoy and his mother [name]. I think that  lymphoproliferative disorder, bone marrow disease, MDS, LGL leukemia are all possibilities at this point.  Bone marrow biopsy in 12/25/2011 did not show any evidence of that. The etiology could be due to reactive process as well. His counts although low, still stable at this point. Will continue to observe him and repeat bone marrow if he develops further  count decline.He dose not need growth factor support at this time.   2. Anemia.  His hemoglobin is actually normal.  3. Follow-up will be in 6 months.   [facility], MD 5/2/20149:37 AM

## 2012-10-05 ENCOUNTER — Other Ambulatory Visit (INDEPENDENT_AMBULATORY_CARE_PROVIDER_SITE_OTHER): Payer: Medicare Other

## 2012-10-05 ENCOUNTER — Encounter: Payer: Self-pay | Admitting: Internal Medicine

## 2012-10-05 ENCOUNTER — Ambulatory Visit (INDEPENDENT_AMBULATORY_CARE_PROVIDER_SITE_OTHER): Payer: Medicare Other | Admitting: Internal Medicine

## 2012-10-05 VITALS — BP 110/74 | HR 65 | Temp 97.7°F | Resp 16 | Wt 219.2 lb

## 2012-10-05 DIAGNOSIS — J209 Acute bronchitis, unspecified: Secondary | ICD-10-CM

## 2012-10-05 DIAGNOSIS — R740 Nonspecific elevation of levels of transaminase and lactic acid dehydrogenase [LDH]: Secondary | ICD-10-CM

## 2012-10-05 DIAGNOSIS — R7402 Elevation of levels of lactic acid dehydrogenase (LDH): Secondary | ICD-10-CM

## 2012-10-05 DIAGNOSIS — Z0000 Encounter for general adult medical examination without abnormal findings: Secondary | ICD-10-CM

## 2012-10-05 LAB — COMPREHENSIVE METABOLIC PANEL
Albumin: 4.5 g/dL (ref 3.5–5.2)
BUN: 15 mg/dL (ref 6–23)
CO2: 23 mEq/L (ref 19–32)
GFR: 91.12 mL/min (ref >60.00)
Glucose, Bld: 81 mg/dL (ref 70–99)
Potassium: 4 mEq/L (ref 3.5–5.1)
Sodium: 138 mEq/L (ref 135–145)
Total Bilirubin: 0.6 mg/dL (ref 0.3–1.2)
Total Protein: 7.7 g/dL (ref 6.0–8.3)

## 2012-10-05 LAB — LIPID PANEL
Cholesterol: 191 mg/dL (ref 0–200)
HDL: 46.4 mg/dL (ref >39.00)

## 2012-10-05 NOTE — Patient Instructions (Signed)
Health Maintenance, Males A healthy lifestyle and preventative care can promote health and wellness.  Maintain regular health, dental, and eye exams.  Eat a healthy diet. Foods like vegetables, fruits, whole grains, low-fat dairy products, and lean protein foods contain the nutrients you need without too many calories. Decrease your intake of foods high in solid fats, added sugars, and salt. Get information about a proper diet from your caregiver, if necessary.  Regular physical exercise is one of the most important things you can do for your health. Most adults should get at least 150 minutes of moderate-intensity exercise (any activity that increases your heart rate and causes you to sweat) each week. In addition, most adults need muscle-strengthening exercises on 2 or more days a week.   Maintain a healthy weight. The body mass index (BMI) is a screening tool to identify possible weight problems. It provides an estimate of body fat based on height and weight. Your caregiver can help determine your BMI, and can help you achieve or maintain a healthy weight. For adults 20 years and older:  A BMI below 18.5 is considered underweight.  A BMI of 18.5 to 24.9 is normal.  A BMI of 25 to 29.9 is considered overweight.  A BMI of 30 and above is considered obese.  Maintain normal blood lipids and cholesterol by exercising and minimizing your intake of saturated fat. Eat a balanced diet with plenty of fruits and vegetables. Blood tests for lipids and cholesterol should begin at age 20 and be repeated every 5 years. If your lipid or cholesterol levels are high, you are over 50, or you are a high risk for heart disease, you may need your cholesterol levels checked more frequently.Ongoing high lipid and cholesterol levels should be treated with medicines, if diet and exercise are not effective.  If you smoke, find out from your caregiver how to quit. If you do not use tobacco, do not start.  If you  choose to drink alcohol, do not exceed 2 drinks per day. One drink is considered to be 12 ounces (355 mL) of beer, 5 ounces (148 mL) of wine, or 1.5 ounces (44 mL) of liquor.  Avoid use of street drugs. Do not share needles with anyone. Ask for help if you need support or instructions about stopping the use of drugs.  High blood pressure causes heart disease and increases the risk of stroke. Blood pressure should be checked at least every 1 to 2 years. Ongoing high blood pressure should be treated with medicines if weight loss and exercise are not effective.  If you are 45 to 39 years old, ask your caregiver if you should take aspirin to prevent heart disease.  Diabetes screening involves taking a blood sample to check your fasting blood sugar level. This should be done once every 3 years, after age 45, if you are within normal weight and without risk factors for diabetes. Testing should be considered at a younger age or be carried out more frequently if you are overweight and have at least 1 risk factor for diabetes.  Colorectal cancer can be detected and often prevented. Most routine colorectal cancer screening begins at the age of 50 and continues through age 75. However, your caregiver may recommend screening at an earlier age if you have risk factors for colon cancer. On a yearly basis, your caregiver may provide home test kits to check for hidden blood in the stool. Use of a small camera at the end of a tube,   to directly examine the colon (sigmoidoscopy or colonoscopy), can detect the earliest forms of colorectal cancer. Talk to your caregiver about this at age 50, when routine screening begins. Direct examination of the colon should be repeated every 5 to 10 years through age 75, unless early forms of pre-cancerous polyps or small growths are found.  Hepatitis C blood testing is recommended for all people born from 1945 through 1965 and any individual with known risks for hepatitis C.  Healthy  men should no longer receive prostate-specific antigen (PSA) blood tests as part of routine cancer screening. Consult with your caregiver about prostate cancer screening.  Testicular cancer screening is not recommended for adolescents or adult males who have no symptoms. Screening includes self-exam, caregiver exam, and other screening tests. Consult with your caregiver about any symptoms you have or any concerns you have about testicular cancer.  Practice safe sex. Use condoms and avoid high-risk sexual practices to reduce the spread of sexually transmitted infections (STIs).  Use sunscreen with a sun protection factor (SPF) of 30 or greater. Apply sunscreen liberally and repeatedly throughout the day. You should seek shade when your shadow is shorter than you. Protect yourself by wearing long sleeves, pants, a wide-brimmed hat, and sunglasses year round, whenever you are outdoors.  Notify your caregiver of new moles or changes in moles, especially if there is a change in shape or color. Also notify your caregiver if a mole is larger than the size of a pencil eraser.  A one-time screening for abdominal aortic aneurysm (AAA) and surgical repair of large AAAs by sound wave imaging (ultrasonography) is recommended for ages 65 to 75 years who are current or former smokers.  Stay current with your immunizations. Document Released: 10/10/2007 Document Revised: 07/06/2011 Document Reviewed: 09/08/2010 [employer] Patient Information 2014 [employer].  

## 2012-10-05 NOTE — Assessment & Plan Note (Signed)
Exam done Vaccines were reviewed Labs ordered Pt ed material was given 

## 2012-10-05 NOTE — Progress Notes (Signed)
  Subjective:    Patient ID: Eugene Mccoy, male    DOB: Oct 28, 1975, 37 y.o.   MRN: [id number]  HPI Comments: He returns for a check-up, he is non-verbal but indicates thru a "thumbs up" that he is doing well. His father is not with him today.     Review of Systems  Unable to perform ROS: Patient nonverbal       Objective:   Physical Exam  Vitals reviewed. Constitutional: He is oriented to person, place, and time. He appears well-developed and well-nourished. No distress.  HENT:  Head: Normocephalic and atraumatic.  Mouth/Throat: Oropharynx is clear and moist. No oropharyngeal exudate.  Eyes: Conjunctivae are normal. Right eye exhibits no discharge. Left eye exhibits no discharge. No scleral icterus.  Neck: Normal range of motion. Neck supple. No JVD present. No tracheal deviation present. No thyromegaly present.  Cardiovascular: Normal rate, regular rhythm, normal heart sounds and intact distal pulses.  Exam reveals no gallop and no friction rub.   No murmur heard. Pulmonary/Chest: Effort normal and breath sounds normal. No stridor. No respiratory distress. He has no wheezes. He has no rales. He exhibits no tenderness.  Abdominal: Soft. Bowel sounds are normal. He exhibits no distension and no mass. There is no tenderness. There is no rebound and no guarding. Hernia confirmed negative in the right inguinal area and confirmed negative in the left inguinal area.  Genitourinary: Testes normal and penis normal. Right testis shows no mass, no swelling and no tenderness. Right testis is descended. Left testis shows no mass, no swelling and no tenderness. Left testis is descended. Circumcised. No penile erythema or penile tenderness. No discharge found.  Microphallus and testicular atrophy  Musculoskeletal: Normal range of motion. He exhibits no edema and no tenderness.  Lymphadenopathy:    He has no cervical adenopathy.       Right: No inguinal adenopathy present.       Left: No inguinal  adenopathy present.  Neurological: He is oriented to person, place, and time.  Skin: Skin is warm and dry. No rash noted. He is not diaphoretic. No erythema. No pallor.  Psychiatric: He has a normal mood and affect. His behavior is normal. Judgment and thought content normal.     Lab Results  Component Value Date   WBC 2.2* 08/26/2012   HGB 13.2 08/26/2012   HCT 37.3* 08/26/2012   PLT 125* 08/26/2012   GLUCOSE 87 08/26/2012   CHOL 185 11/07/2010   TRIG 96.0 11/07/2010   HDL 53.80 11/07/2010   LDLCALC 112* 11/07/2010   ALT 43 08/26/2012   AST 37* 08/26/2012   NA 141 08/26/2012   K 4.0 08/26/2012   CL 107 08/26/2012   CREATININE 1.2 08/26/2012   BUN 16.2 08/26/2012   CO2 22 08/26/2012   TSH 0.91 11/07/2010   INR 1.03 12/25/2010   HGBA1C 5.1 11/07/2010       Assessment & Plan:

## 2012-10-05 NOTE — Assessment & Plan Note (Signed)
I will recheck his LFT.s and will screen him for viral hepatitis I am concerned that he has fatty liver so I have ordered an U/S

## 2012-10-06 ENCOUNTER — Encounter: Payer: Self-pay | Admitting: Internal Medicine

## 2012-10-06 LAB — HEPATITIS A ANTIBODY, TOTAL: Hep A Total Ab: NEGATIVE

## 2012-10-14 ENCOUNTER — Ambulatory Visit
Admission: RE | Admit: 2012-10-14 | Discharge: 2012-10-14 | Disposition: A | Discharge status: Home / Self Care | Payer: Medicare Other | Source: Ambulatory Visit | Attending: Internal Medicine | Admitting: Internal Medicine

## 2012-10-16 ENCOUNTER — Encounter: Payer: Self-pay | Admitting: Internal Medicine

## 2013-02-28 ENCOUNTER — Encounter (INDEPENDENT_AMBULATORY_CARE_PROVIDER_SITE_OTHER): Payer: Self-pay

## 2013-02-28 ENCOUNTER — Ambulatory Visit (HOSPITAL_BASED_OUTPATIENT_CLINIC_OR_DEPARTMENT_OTHER): Payer: Medicare Other | Admitting: Oncology

## 2013-02-28 ENCOUNTER — Other Ambulatory Visit (HOSPITAL_BASED_OUTPATIENT_CLINIC_OR_DEPARTMENT_OTHER): Payer: Medicare Other | Admitting: Lab

## 2013-02-28 ENCOUNTER — Telehealth: Payer: Self-pay | Admitting: Oncology

## 2013-02-28 VITALS — BP 119/70 | HR 63 | Temp 97.6°F | Resp 18 | Ht 69.0 in | Wt 218.6 lb

## 2013-02-28 DIAGNOSIS — D709 Neutropenia, unspecified: Secondary | ICD-10-CM

## 2013-02-28 DIAGNOSIS — D72820 Lymphocytosis (symptomatic): Secondary | ICD-10-CM

## 2013-02-28 DIAGNOSIS — D61818 Other pancytopenia: Secondary | ICD-10-CM

## 2013-02-28 LAB — CBC WITH DIFFERENTIAL/PLATELET
BASO%: 0 % (ref 0.0–2.0)
EOS%: 2.4 % (ref 0.0–7.0)
HCT: 35.6 % — ABNORMAL LOW (ref 38.4–49.9)
LYMPH%: 69.4 % — ABNORMAL HIGH (ref 14.0–49.0)
MCH: 36 pg — ABNORMAL HIGH (ref 27.2–33.4)
MCHC: 36.2 g/dL — ABNORMAL HIGH (ref 32.0–36.0)
MONO%: 6.8 % (ref 0.0–14.0)
NEUT%: 21.4 % — ABNORMAL LOW (ref 39.0–75.0)
Platelets: 136 10*3/uL — ABNORMAL LOW (ref 140–400)

## 2013-02-28 LAB — COMPREHENSIVE METABOLIC PANEL (CC13)
ALT: 22 U/L (ref 0–55)
AST: 21 U/L (ref 5–34)
Anion Gap: 11 mEq/L (ref 3–11)
Creatinine: 1.1 mg/dL (ref 0.7–1.3)
Total Bilirubin: 0.55 mg/dL (ref 0.20–1.20)

## 2013-02-28 NOTE — Telephone Encounter (Signed)
gv pt/relative appt schedule for May 2015.

## 2013-02-28 NOTE — Progress Notes (Signed)
Hematology and Oncology Follow Up Visit  Eugene Mccoy [id number] Aug 11, 1975 37 y.o. 02/28/2013 10:12 AM  CC: Eugene Linger, MD   Principle Diagnosis: This is a 37 year old gentleman with lymphocytosis diagnosed in 11/2010. Work up has not shown any disease. He is S/P bone marrow biopsy and CT scans on 11/2010. All are with normal limit.   Interim History: Eugene Mccoy is a pleasant gentleman with mild mental retardation whom I saw initially on 12/02/2010 with evaluation for lymphocytosis and neutropenia.  He continued to do well since his last visit.  Eugene Mccoy has been asymptomatic and continued to be.  He has not reported any fevers, has not reported any chills, has not reported any hospitalization and has not had any illnesses.  Overall, he has continued to be active and works in [employer].  Has not had any problems at this point. No recent fevers or syncopal episodes.    Medications: I have reviewed the patient's current medications. No current outpatient prescriptions on file.  Allergies: No Known Allergies  Past Medical History, Surgical history, Social history, and Family History were reviewed and updated.  Review of Systems:  Remaining ROS negative. Physical Exam: Blood pressure 119/70, pulse 63, temperature 97.6 F (36.4 C), temperature source Oral, resp. rate 18, height 5\' 9"  (1.753 m), weight 218 lb 9.6 oz (99.156 kg), SpO2 100.00%. ECOG: 0 General appearance: alert Head: Normocephalic, without obvious abnormality, atraumatic Neck: no adenopathy, no carotid bruit, no JVD, supple, symmetrical, trachea midline and thyroid not enlarged, symmetric, no tenderness/mass/nodules Lymph nodes: Cervical, supraclavicular, and axillary nodes normal. Heart:regular rate and rhythm, S1, S2 normal, no murmur, click, rub or gallop Lung:chest clear, no wheezing, rales, normal symmetric air entry Abdomin: soft, non-tender, without masses or organomegaly EXT:no erythema, induration, or  nodules   Lab Results: Lab Results  Component Value Date   WBC 2.1* 02/28/2013   HGB 12.9* 02/28/2013   HCT 35.6* 02/28/2013   MCV 99.4* 02/28/2013   PLT 136* 02/28/2013     Chemistry      Component Value Date/Time   NA 138 10/05/2012 1034   NA 141 08/26/2012 0905   K 4.0 10/05/2012 1034   K 4.0 08/26/2012 0905   CL 106 10/05/2012 1034   CL 107 08/26/2012 0905   CO2 23 10/05/2012 1034   CO2 22 08/26/2012 0905   BUN 15 10/05/2012 1034   BUN 16.2 08/26/2012 0905   CREATININE 1.2 10/05/2012 1034   CREATININE 1.2 08/26/2012 0905      Component Value Date/Time   CALCIUM 9.7 10/05/2012 1034   CALCIUM 9.6 08/26/2012 0905   ALKPHOS 52 10/05/2012 1034   ALKPHOS 62 08/26/2012 0905   AST 24 10/05/2012 1034   AST 37* 08/26/2012 0905   ALT 23 10/05/2012 1034   ALT 43 08/26/2012 0905   BILITOT 0.6 10/05/2012 1034   BILITOT 0.61 08/26/2012 0905       Impression and Plan: 1. This is a pleasant 37 year old gentleman with lymphocytosis and neutropenia.  The differential diagnosis again was reiterated to Eugene Mccoy and his mother Eugene Mccoy. I think that lymphoproliferative disorder, bone marrow disease, MDS, LGL leukemia are all possibilities at this point.  Bone marrow biopsy in 12/25/2011 did not show any evidence of that. The etiology could be due to reactive process as well. His counts although low, still stable at this point. Will continue to observe him and repeat bone marrow if he develops further count decline.He dose not need growth factor support at this  time.   2. Anemia.  His hemoglobin is actually normal.  3. Follow-up will be in 6 months.   ,, MD 11/4/201410:12 AM

## 2013-06-27 ENCOUNTER — Encounter: Payer: Self-pay | Admitting: Family Medicine

## 2013-06-27 ENCOUNTER — Ambulatory Visit (INDEPENDENT_AMBULATORY_CARE_PROVIDER_SITE_OTHER): Payer: Medicare Other | Admitting: Family Medicine

## 2013-06-27 VITALS — BP 118/76 | HR 65 | Temp 98.0°F | Resp 16 | Ht 69.0 in | Wt 211.4 lb

## 2013-06-27 DIAGNOSIS — J029 Acute pharyngitis, unspecified: Secondary | ICD-10-CM

## 2013-06-27 DIAGNOSIS — R059 Cough, unspecified: Secondary | ICD-10-CM

## 2013-06-27 DIAGNOSIS — R05 Cough: Secondary | ICD-10-CM

## 2013-06-27 DIAGNOSIS — J209 Acute bronchitis, unspecified: Secondary | ICD-10-CM

## 2013-06-27 LAB — POCT RAPID STREP A (OFFICE): Rapid Strep A Screen: NEGATIVE

## 2013-06-27 MED ORDER — AZITHROMYCIN 250 MG PO TABS: ORAL_TABLET | ORAL | Status: DC

## 2013-06-27 NOTE — Patient Instructions (Signed)
You should receive a call or letter about your lab results within the next week (throat culture), but initial strep test negative. Can start antibiotic for possible bronchitis.  Over the counter mucinex or mucinex DM if needed for cough, lozenges, and tylenol if needed for sore throat as needed, drink plenty of fluids. Return to the clinic or go to the nearest emergency room if any of your symptoms worsen or new symptoms occur. Cough, Adult  A cough is a reflex that helps clear your throat and airways. It can help heal the body or may be a reaction to an irritated airway. A cough may only last 2 or 3 weeks (acute) or may last more than 8 weeks (chronic).  CAUSES Acute cough:  Viral or bacterial infections. Chronic cough:  Infections.  Allergies.  Asthma.  Post-nasal drip.  Smoking.  Heartburn or acid reflux.  Some medicines.  Chronic lung problems (COPD).  Cancer. SYMPTOMS   Cough.  Fever.  Chest pain.  Increased breathing rate.  High-pitched whistling sound when breathing (wheezing).  Colored mucus that you cough up (sputum). TREATMENT   A bacterial cough may be treated with antibiotic medicine.  A viral cough must run its course and will not respond to antibiotics.  Your caregiver may recommend other treatments if you have a chronic cough. HOME CARE INSTRUCTIONS   Only take over-the-counter or prescription medicines for pain, discomfort, or fever as directed by your caregiver. Use cough suppressants only as directed by your caregiver.  Use a cold steam vaporizer or humidifier in your bedroom or home to help loosen secretions.  Sleep in a semi-upright position if your cough is worse at night.  Rest as needed.  Stop smoking if you smoke. SEEK IMMEDIATE MEDICAL CARE IF:   You have pus in your sputum.  Your cough starts to worsen.  You cannot control your cough with suppressants and are losing sleep.  You begin coughing up blood.  You have difficulty  breathing.  You develop pain which is getting worse or is uncontrolled with medicine.  You have a fever. MAKE SURE YOU:   Understand these instructions.  Will watch your condition.  Will get help right away if you are not doing well or get worse. Document Released: 10/10/2010 Document Revised: 07/06/2011 Document Reviewed: 10/10/2010 [facility] Patient Information 2014 [place]. Sore Throat A sore throat is pain, burning, irritation, or scratchiness of the throat. There is often pain or tenderness when swallowing or talking. A sore throat may be accompanied by other symptoms, such as coughing, sneezing, fever, and swollen neck glands. A sore throat is often the first sign of another sickness, such as a cold, flu, strep throat, or mononucleosis (commonly known as mono). Most sore throats go away without medical treatment. CAUSES  The most common causes of a sore throat include:  A viral infection, such as a cold, flu, or mono.  A bacterial infection, such as strep throat, tonsillitis, or whooping cough.  Seasonal allergies.  Dryness in the air.  Irritants, such as smoke or pollution.  Gastroesophageal reflux disease (GERD). HOME CARE INSTRUCTIONS   Only take over-the-counter medicines as directed by your caregiver.  Drink enough fluids to keep your urine clear or pale yellow.  Rest as needed.  Try using throat sprays, lozenges, or sucking on hard candy to ease any pain (if older than 4 years or as directed).  Sip warm liquids, such as broth, herbal tea, or warm water with honey to relieve pain temporarily.  You may also eat or drink cold or frozen liquids such as frozen ice pops.  Gargle with salt water (mix 1 tsp salt with 8 oz of water).  Do not smoke and avoid secondhand smoke.  Put a cool-mist humidifier in your bedroom at night to moisten the air. You can also turn on a hot shower and sit in the bathroom with the door closed for 5 10 minutes. SEEK IMMEDIATE  MEDICAL CARE IF:  You have difficulty breathing.  You are unable to swallow fluids, soft foods, or your saliva.  You have increased swelling in the throat.  Your sore throat does not get better in 7 days.  You have nausea and vomiting.  You have a fever or persistent symptoms for more than 2 3 days.  You have a fever and your symptoms suddenly get worse. MAKE SURE YOU:   Understand these instructions.  Will watch your condition.  Will get help right away if you are not doing well or get worse. Document Released: 05/21/2004 Document Revised: 03/30/2012 Document Reviewed: 12/20/2011 [facility] Patient Information 2014 [place], [place].

## 2013-06-27 NOTE — Progress Notes (Signed)
Subjective:    Patient ID: Eugene Mccoy, male    DOB: April 03, 1976, 38 y.o.   MRN: [id number]  HPI Eugene Mccoy is a 38 y.o. male  C/o cough and sore throat. Here with parent who is helping provide history - he is hearing impaired and hx of mild mental retardation.   Initially cold symptoms about 3 weeks ago, cough, congestion.  No fever. Now hurts to swallow past few days. Still able to eat, drink - but hurts to swallow. Nonproductive cough.   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 No restrictions.   Tx: Salve, theraflu.   SH: lives with parents. Nonsmoker.    Patient Active Problem List   Diagnosis Date Noted  . Nonspecific elevation of levels of transaminase or lactic acid dehydrogenase (LDH) 10/05/2012  . Hearing loss 11/07/2010  . Pancytopenia 11/07/2010  . MENTAL RETARDATION, MILD 05/09/2010   Past Medical History  Diagnosis Date  . MENTAL RETARDATION, MILD 05/09/2010  . Deafness    Past Surgical History  Procedure Laterality Date  . Tonsillectomy    . Leukocytopenia     No Known Allergies Prior to Admission medications   Medication Sig Start Date End Date Taking? Authorizing Provider  pseudoephedrine-acetaminophen (TYLENOL SINUS) 30-500 MG TABS Take 1 tablet by mouth every 4 (four) hours as needed.   Yes Historical Provider, MD   History   Social History  . Marital Status: Single    Spouse Name: N/A    Number of Children: N/A  . Years of Education: N/A   Occupational History  . Food services [employer]   Social History Main Topics  . Smoking status: Never Smoker   . Smokeless tobacco: Not on file  . Alcohol Use: No  . Drug Use: No  . Sexual Activity: Not Currently   Other Topics Concern  . Not on file   Social History Narrative   Regular exercise-no               Review of Systems  Constitutional: Negative for fever.  HENT: Positive for sore throat and trouble swallowing. Negative for mouth sores.     Respiratory: Positive for cough. Negative for shortness of breath and wheezing.        Objective:   Physical Exam  Vitals reviewed. Constitutional: He is oriented to person, place, and time. He appears well-developed and well-nourished.  HENT:  Head: Normocephalic and atraumatic.  Right Ear: Tympanic membrane, external ear and ear canal normal.  Left Ear: Tympanic membrane, external ear and ear canal normal.  Nose: No rhinorrhea.  Mouth/Throat: Mucous membranes are normal. Posterior oropharyngeal erythema present. No oropharyngeal exudate, posterior oropharyngeal edema or tonsillar abscesses.  Cerumen in L greater than r canal, but visualized tm's pearly gray. No fluid in canal.   Eyes: Conjunctivae are normal. Pupils are equal, round, and reactive to light.  Neck: Neck supple.  Cardiovascular: Normal rate, regular rhythm, normal heart sounds and intact distal pulses.   No murmur heard. Pulmonary/Chest: Effort normal and breath sounds normal. He has no wheezes. He has no rhonchi. He has no rales.  Clear.   Abdominal: Soft. There is no tenderness.  Lymphadenopathy:    He has cervical adenopathy (ttp R ac, slighly enlarged. ).  Neurological: He is alert and oriented to person, place, and time.  Skin: Skin is warm and dry. No rash noted.  Psychiatric: He has a normal mood and affect. His behavior is normal.  Filed Vitals:   06/27/13 0819  BP: 118/76  Pulse: 65  Temp: 98 F (36.7 C)  TempSrc: Oral  Resp: 16  Height: 5\' 9"  (1.753 m)  Weight: 211 lb 6.4 oz (95.89 kg)  SpO2: 100%    Results for orders placed in visit on 06/27/13  POCT RAPID STREP A (OFFICE)      Result Value Ref Range   Rapid Strep A Screen Negative  Negative      Assessment & Plan:   Eugene Mccoy is a 38 y.o. male Cough - Plan: POCT rapid strep A, azithromycin (ZITHROMAX) 250 MG tablet  Sore throat - Plan: POCT rapid strep A, Throat culture ([facility]), azithromycin (ZITHROMAX) 250 MG tablet  Acute  bronchitis   Persistent cough with secondary worsening of sore throat. Will treat with Zpak for cough and possible bronchitis, and throat cx sent. Sx care discussed with parent and handout given. rtc precautions.   Meds ordered this encounter  Medications  . pseudoephedrine-acetaminophen (TYLENOL SINUS) 30-500 MG TABS    Sig: Take 1 tablet by mouth every 4 (four) hours as needed.  Marland Kitchen azithromycin (ZITHROMAX) 250 MG tablet    Sig: Take 2 pills by mouth on day 1, then 1 pill by mouth per day on days 2 through 5.    Dispense:  6 each    Refill:  0   Patient Instructions  You should receive a call or letter about your lab results within the next week (throat culture), but initial strep test negative. Can start antibiotic for possible bronchitis.  Over the counter mucinex or mucinex DM if needed for cough, lozenges, and tylenol if needed for sore throat as needed, drink plenty of fluids. Return to the clinic or go to the nearest emergency room if any of your symptoms worsen or new symptoms occur. Cough, Adult  A cough is a reflex that helps clear your throat and airways. It can help heal the body or may be a reaction to an irritated airway. A cough may only last 2 or 3 weeks (acute) or may last more than 8 weeks (chronic).  CAUSES Acute cough:  Viral or bacterial infections. Chronic cough:  Infections.  Allergies.  Asthma.  Post-nasal drip.  Smoking.  Heartburn or acid reflux.  Some medicines.  Chronic lung problems (COPD).  Cancer. SYMPTOMS   Cough.  Fever.  Chest pain.  Increased breathing rate.  High-pitched whistling sound when breathing (wheezing).  Colored mucus that you cough up (sputum). TREATMENT   A bacterial cough may be treated with antibiotic medicine.  A viral cough must run its course and will not respond to antibiotics.  Your caregiver may recommend other treatments if you have a chronic cough. HOME CARE INSTRUCTIONS   Only take  over-the-counter or prescription medicines for pain, discomfort, or fever as directed by your caregiver. Use cough suppressants only as directed by your caregiver.  Use a cold steam vaporizer or humidifier in your bedroom or home to help loosen secretions.  Sleep in a semi-upright position if your cough is worse at night.  Rest as needed.  Stop smoking if you smoke. SEEK IMMEDIATE MEDICAL CARE IF:   You have pus in your sputum.  Your cough starts to worsen.  You cannot control your cough with suppressants and are losing sleep.  You begin coughing up blood.  You have difficulty breathing.  You develop pain which is getting worse or is uncontrolled with medicine.  You have a fever. MAKE SURE  YOU:   Understand these instructions.  Will watch your condition.  Will get help right away if you are not doing well or get worse. Document Released: 10/10/2010 Document Revised: 07/06/2011 Document Reviewed: 10/10/2010 [facility] Patient Information 2014 [place]. Sore Throat A sore throat is pain, burning, irritation, or scratchiness of the throat. There is often pain or tenderness when swallowing or talking. A sore throat may be accompanied by other symptoms, such as coughing, sneezing, fever, and swollen neck glands. A sore throat is often the first sign of another sickness, such as a cold, flu, strep throat, or mononucleosis (commonly known as mono). Most sore throats go away without medical treatment. CAUSES  The most common causes of a sore throat include:  A viral infection, such as a cold, flu, or mono.  A bacterial infection, such as strep throat, tonsillitis, or whooping cough.  Seasonal allergies.  Dryness in the air.  Irritants, such as smoke or pollution.  Gastroesophageal reflux disease (GERD). HOME CARE INSTRUCTIONS   Only take over-the-counter medicines as directed by your caregiver.  Drink enough fluids to keep your urine clear or pale yellow.  Rest as  needed.  Try using throat sprays, lozenges, or sucking on hard candy to ease any pain (if older than 4 years or as directed).  Sip warm liquids, such as broth, herbal tea, or warm water with honey to relieve pain temporarily. You may also eat or drink cold or frozen liquids such as frozen ice pops.  Gargle with salt water (mix 1 tsp salt with 8 oz of water).  Do not smoke and avoid secondhand smoke.  Put a cool-mist humidifier in your bedroom at night to moisten the air. You can also turn on a hot shower and sit in the bathroom with the door closed for 5 10 minutes. SEEK IMMEDIATE MEDICAL CARE IF:  You have difficulty breathing.  You are unable to swallow fluids, soft foods, or your saliva.  You have increased swelling in the throat.  Your sore throat does not get better in 7 days.  You have nausea and vomiting.  You have a fever or persistent symptoms for more than 2 3 days.  You have a fever and your symptoms suddenly get worse. MAKE SURE YOU:   Understand these instructions.  Will watch your condition.  Will get help right away if you are not doing well or get worse. Document Released: 05/21/2004 Document Revised: 03/30/2012 Document Reviewed: 12/20/2011 [facility] Patient Information 2014 [place], [place].

## 2013-06-29 LAB — CULTURE, GROUP A STREP: ORGANISM ID, BACTERIA: NORMAL

## 2013-08-29 ENCOUNTER — Encounter: Payer: Self-pay | Admitting: Oncology

## 2013-08-29 ENCOUNTER — Ambulatory Visit (HOSPITAL_BASED_OUTPATIENT_CLINIC_OR_DEPARTMENT_OTHER): Payer: Medicare Other | Admitting: Oncology

## 2013-08-29 ENCOUNTER — Other Ambulatory Visit (HOSPITAL_BASED_OUTPATIENT_CLINIC_OR_DEPARTMENT_OTHER): Payer: Medicare Other

## 2013-08-29 VITALS — BP 116/70 | HR 72 | Temp 98.0°F | Resp 20 | Ht 69.0 in | Wt 217.1 lb

## 2013-08-29 DIAGNOSIS — D61818 Other pancytopenia: Secondary | ICD-10-CM

## 2013-08-29 DIAGNOSIS — D709 Neutropenia, unspecified: Secondary | ICD-10-CM

## 2013-08-29 DIAGNOSIS — D72820 Lymphocytosis (symptomatic): Secondary | ICD-10-CM

## 2013-08-29 DIAGNOSIS — D649 Anemia, unspecified: Secondary | ICD-10-CM

## 2013-08-29 LAB — CBC WITH DIFFERENTIAL/PLATELET
BASO%: 0.6 % (ref 0.0–2.0)
Basophils Absolute: 0 10*3/uL (ref 0.0–0.1)
EOS%: 2.5 % (ref 0.0–7.0)
Eosinophils Absolute: 0.1 10*3/uL (ref 0.0–0.5)
HEMATOCRIT: 37.7 % — AB (ref 38.4–49.9)
HGB: 12.9 g/dL — ABNORMAL LOW (ref 13.0–17.1)
LYMPH%: 49.5 % — ABNORMAL HIGH (ref 14.0–49.0)
MCH: 35.9 pg — ABNORMAL HIGH (ref 27.2–33.4)
MCHC: 34.2 g/dL (ref 32.0–36.0)
MCV: 105.1 fL — ABNORMAL HIGH (ref 79.3–98.0)
MONO#: 0.3 10*3/uL (ref 0.1–0.9)
MONO%: 14.5 % — ABNORMAL HIGH (ref 0.0–14.0)
NEUT#: 0.7 10*3/uL — ABNORMAL LOW (ref 1.5–6.5)
NEUT%: 32.9 % — AB (ref 39.0–75.0)
Platelets: 123 10*3/uL — ABNORMAL LOW (ref 140–400)
RBC: 3.59 10*6/uL — ABNORMAL LOW (ref 4.20–5.82)
RDW: 14.3 % (ref 11.0–14.6)
WBC: 2.3 10*3/uL — AB (ref 4.0–10.3)
lymph#: 1.1 10*3/uL (ref 0.9–3.3)

## 2013-08-29 NOTE — Progress Notes (Signed)
Hematology and Oncology Follow Up Visit  Eugene Mccoy [id number] [birth year] 38 y.o. 08/29/2013 10:06 AM  CC: Eugene Calico, MD   Principle Diagnosis: This is a 38 year old gentleman with lymphocytosis diagnosed in 11/2010. Work up has not shown any disease. He is S/P bone marrow biopsy and CT scans on 11/2010. All are with normal limit.   Interim History: Eugene Mccoy is a pleasant gentleman with mild mental retardation whom I saw initially on 12/02/2010 with evaluation for lymphocytosis and neutropenia.  He continued to do well since his last visit.  Eugene Mccoy has been asymptomatic and continued to be.  He has not reported any fevers, has not reported any chills, has not reported any hospitalization and has not had any illnesses.  Overall, he has continued to be active and works in [employer].  He has reported occasional bruising but they are usually after trauma or heavy contact. He has not reported any headaches or blurry vision or double vision. Has not reported any chest pain or difficulty breathing. Has not reported any nausea or vomiting or abdominal pain. Has not reported any change in his bowel habits or urinary symptoms.   Medications: I have reviewed the patient's current medications.  Current Outpatient Prescriptions  Medication Sig Dispense Refill  . azithromycin (ZITHROMAX) 250 MG tablet Take 2 pills by mouth on day 1, then 1 pill by mouth per day on days 2 through 5.  6 each  0  . pseudoephedrine-acetaminophen (TYLENOL SINUS) 30-500 MG TABS Take 1 tablet by mouth every 4 (four) hours as needed.       No current facility-administered medications for this visit.      Allergies: No Known Allergies  Past Medical History, Surgical history, Social history, and Family History were reviewed and updated.  Review of Systems:  Remaining ROS negative.  Physical Exam: Blood pressure 116/70, pulse 72, temperature 98 F (36.7 C), temperature source Oral, resp. rate 20, height 5' 9"   (1.753 m), weight 217 lb 1.6 oz (98.476 kg), SpO2 100.00%. ECOG: 0 General appearance: alert awake and no distress. Head: Normocephalic, without obvious abnormality, atraumatic Neck: no adenopathy, no carotid bruit, no JVD, supple, symmetrical, trachea midline and thyroid not enlarged, symmetric, no tenderness/mass/nodules Lymph nodes: Cervical, supraclavicular, and axillary nodes normal. Heart:regular rate and rhythm, S1, S2 normal, no murmur, click, rub or gallop Lung:chest clear, no wheezing, rales, normal symmetric air entry Abdomin: soft, non-tender, without masses or organomegaly EXT:no erythema, induration, or nodules   Lab Results: Lab Results  Component Value Date   WBC 2.3* 08/29/2013   HGB 12.9* 08/29/2013   HCT 37.7* 08/29/2013   MCV 105.1* 08/29/2013   PLT 123* 08/29/2013     Chemistry      Component Value Date/Time   NA 139 02/28/2013 0941   NA 138 10/05/2012 1034   K 4.1 02/28/2013 0941   K 4.0 10/05/2012 1034   CL 106 10/05/2012 1034   CL 107 08/26/2012 0905   CO2 21* 02/28/2013 0941   CO2 23 10/05/2012 1034   BUN 16.0 02/28/2013 0941   BUN 15 10/05/2012 1034   CREATININE 1.1 02/28/2013 0941   CREATININE 1.2 10/05/2012 1034      Component Value Date/Time   CALCIUM 10.2 02/28/2013 0941   CALCIUM 9.7 10/05/2012 1034   ALKPHOS 57 02/28/2013 0941   ALKPHOS 52 10/05/2012 1034   AST 21 02/28/2013 0941   AST 24 10/05/2012 1034   ALT 22 02/28/2013 0941   ALT 23 10/05/2012 1034  BILITOT 0.55 02/28/2013 0941   BILITOT 0.6 10/05/2012 1034       Impression and Plan: 1. This is a pleasant 38 year old gentleman with lymphocytosis and neutropenia.  The differential diagnosis again was reiterated to Eugene Mccoy and his mother [name]. I think that lymphoproliferative disorder, bone marrow disease, MDS, LGL leukemia are all possibilities at this point.  Bone marrow biopsy in 12/25/2011 did not show any evidence of that. The etiology could be due to reactive process or autoimmune disorder as well. His  counts although low, still stable at this point. Will continue to observe him and repeat bone marrow if he develops further count decline.He dose not need growth factor support at this time.   2. Anemia.  His hemoglobin is actually normal.  3. Follow-up will be in 6 months.   Eugene Portela, MD 5/5/201510:06 AM

## 2013-09-01 ENCOUNTER — Telehealth: Payer: Self-pay | Admitting: Oncology

## 2013-09-01 NOTE — Telephone Encounter (Signed)
s.w. pt mother and advised on NOV appt...ok and aware

## 2013-09-30 IMAGING — US US ABDOMEN COMPLETE
1 series · 13 of 25 positions shown · non-contrast
Comparison: CT of the abdomen on 12/17/2010.

CLINICAL DATA: Elevated liver enzymes.

ABDOMEN ULTRASOUND
TECHNIQUE: Complete abdominal ultrasound examination was performed
including evaluation of the liver, gallbladder, bile ducts,
pancreas, kidneys, spleen, IVC, and abdominal aorta.

[Series 1: us abdomen complete · 0.32mm/px · 13 of 72 slices shown]
[im 1/72]
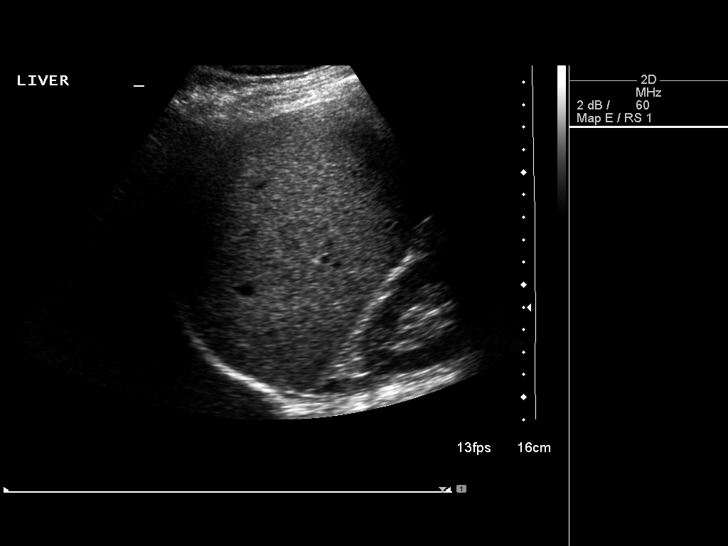
[im 6/72]
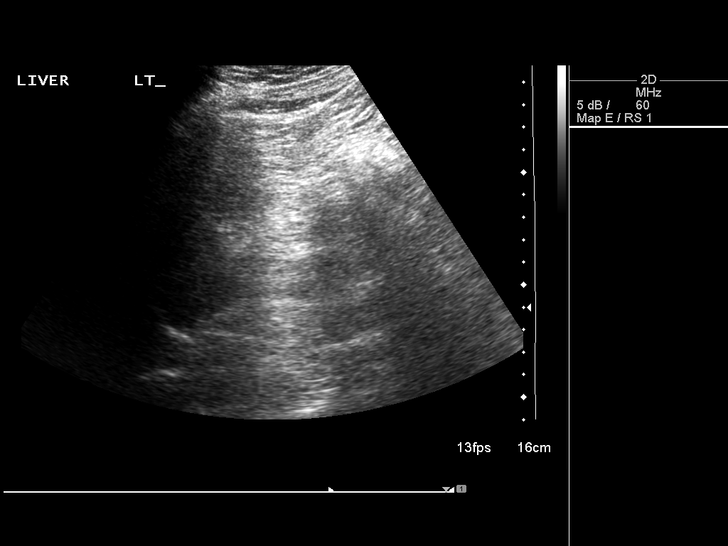
[im 12/72]
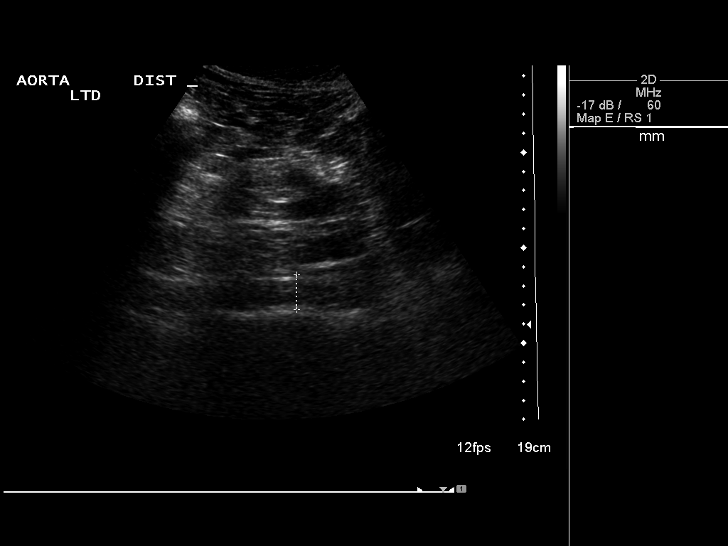
[im 18/72]
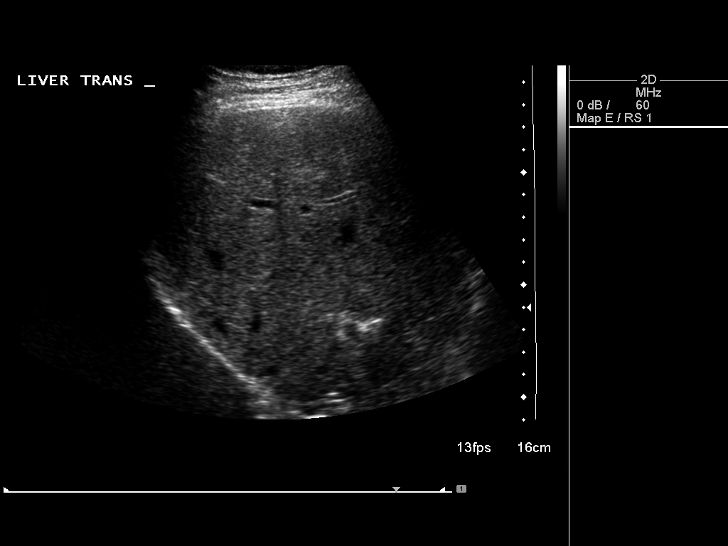
[im 24/72]
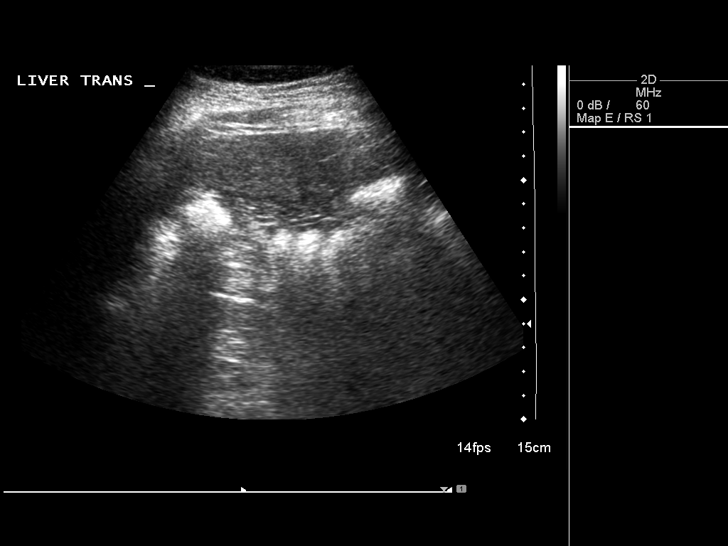
[im 30/72]
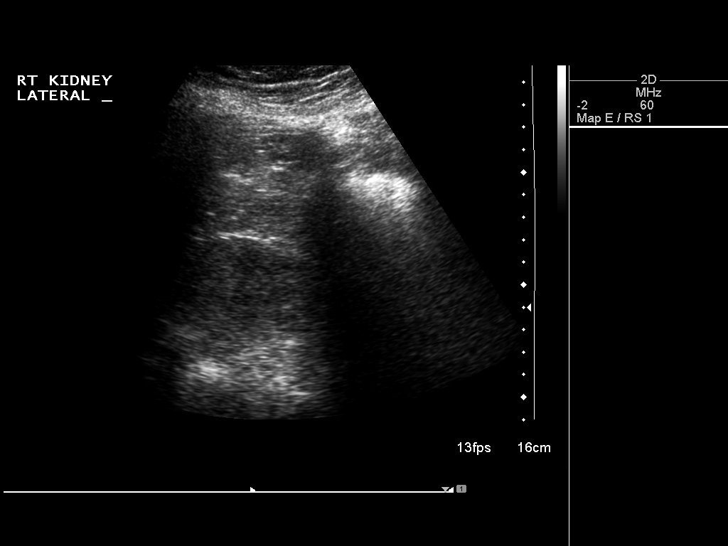
[im 36/72]
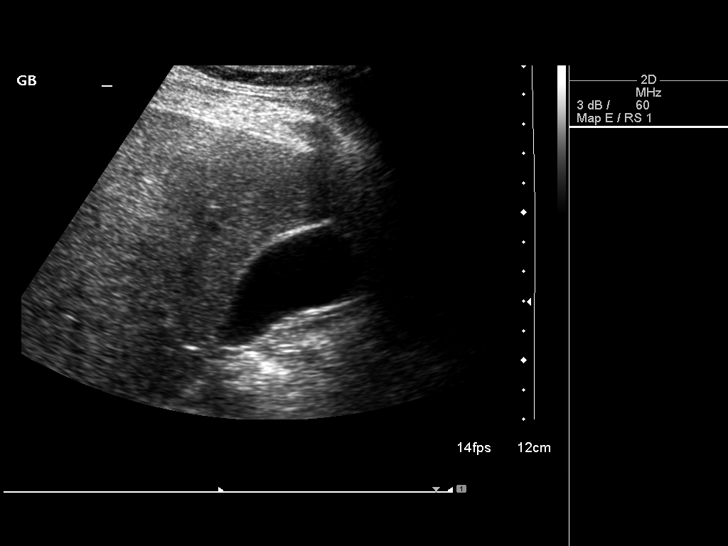
[im 42/72]
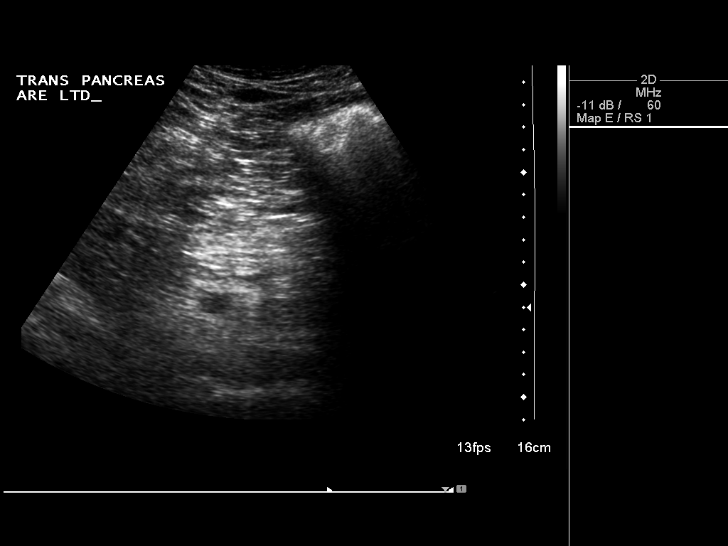
[im 48/72]
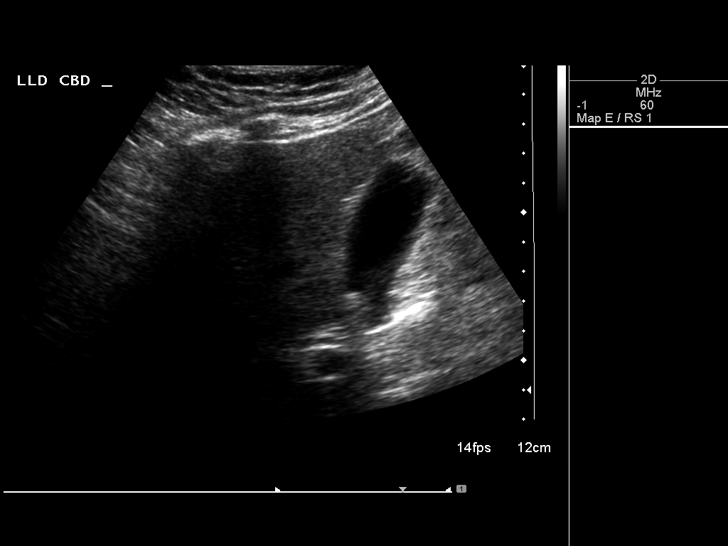
[im 54/72]
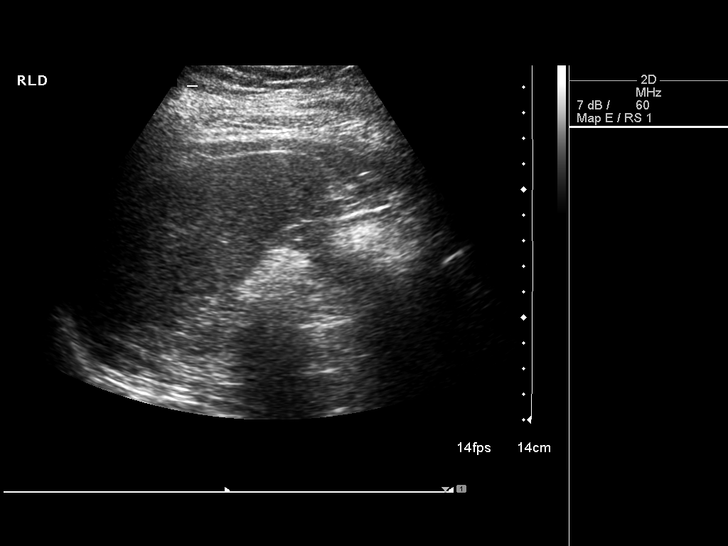
[im 60/72]
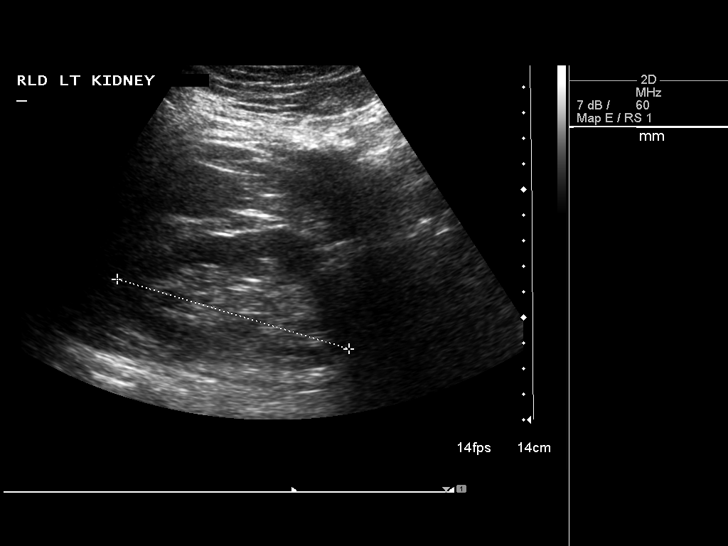
[im 66/72]
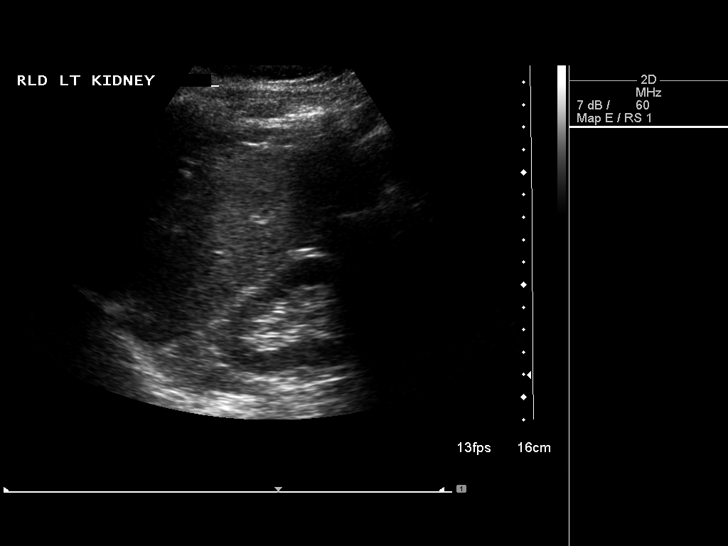
[im 72/72]
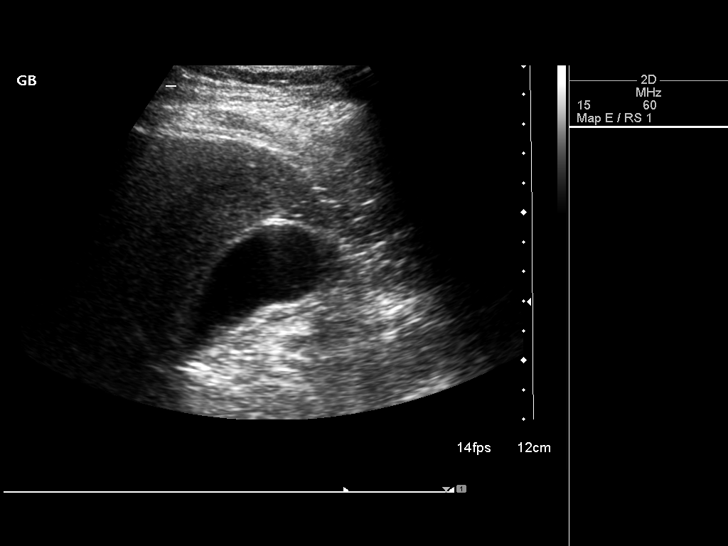

[13 of 25 positions shown; findings below may reference images not displayed]

FINDINGS: Gallbladder:  No shadowing gallstones are identified.  Minimal
echogenicity within the gallbladder may be consistent with a small
amount of biliary sludge.  There is no evidence of gallbladder
distention, wall thickening or pericholecystic fluid.  No
sonographic Murphy's sign was elicited.

Common Bile Duct:  Normal caliber of 3 mm.

Liver:  No focal mass lesion seen.  Within normal limits in
parenchymal echogenicity. No biliary ductal dilatation identified.

IVC:  Very limited visualization of the inferior vena cava.

Pancreas:  The pancreas is essentially nonvisualized by ultrasound.

Spleen:  The spleen is of normal echotexture and size.

Kidneys:  Both kidneys measure approximately 9.6 cm and show no
evidence of hydronephrosis or focal lesion.

Abdominal Aorta:  The abdominal aorta is poorly visualized.
Proximal to mid aorta demonstrate normal caliber.
IMPRESSION: Unremarkable abdominal ultrasound.  There may be a small amount of
biliary sludge in the gallbladder.

## 2014-02-08 ENCOUNTER — Ambulatory Visit (INDEPENDENT_AMBULATORY_CARE_PROVIDER_SITE_OTHER): Payer: Medicare Other | Admitting: Internal Medicine

## 2014-02-08 ENCOUNTER — Encounter: Payer: Self-pay | Admitting: Internal Medicine

## 2014-02-08 ENCOUNTER — Other Ambulatory Visit (INDEPENDENT_AMBULATORY_CARE_PROVIDER_SITE_OTHER): Payer: Medicare Other

## 2014-02-08 VITALS — BP 120/74 | HR 64 | Temp 98.3°F | Resp 16 | Ht 69.0 in | Wt 215.0 lb

## 2014-02-08 DIAGNOSIS — R7401 Elevation of levels of liver transaminase levels: Secondary | ICD-10-CM

## 2014-02-08 DIAGNOSIS — Z23 Encounter for immunization: Secondary | ICD-10-CM

## 2014-02-08 DIAGNOSIS — F70 Mild intellectual disabilities: Secondary | ICD-10-CM

## 2014-02-08 DIAGNOSIS — R740 Nonspecific elevation of levels of transaminase and lactic acid dehydrogenase [LDH]: Secondary | ICD-10-CM

## 2014-02-08 DIAGNOSIS — R739 Hyperglycemia, unspecified: Secondary | ICD-10-CM

## 2014-02-08 DIAGNOSIS — R7402 Elevation of levels of lactic acid dehydrogenase (LDH): Secondary | ICD-10-CM

## 2014-02-08 DIAGNOSIS — D61818 Other pancytopenia: Secondary | ICD-10-CM

## 2014-02-08 DIAGNOSIS — Z0000 Encounter for general adult medical examination without abnormal findings: Secondary | ICD-10-CM

## 2014-02-08 LAB — COMPREHENSIVE METABOLIC PANEL
ALT: 28 U/L (ref 0–53)
AST: 24 U/L (ref 0–37)
Albumin: 4.2 g/dL (ref 3.5–5.2)
Alkaline Phosphatase: 56 U/L (ref 39–117)
BUN: 17 mg/dL (ref 6–23)
CALCIUM: 9.8 mg/dL (ref 8.4–10.5)
CHLORIDE: 105 meq/L (ref 96–112)
CO2: 25 meq/L (ref 19–32)
Creatinine, Ser: 1.2 mg/dL (ref 0.4–1.5)
GFR: 88.71 mL/min (ref >60.00)
Glucose, Bld: 84 mg/dL (ref 70–99)
Potassium: 4.8 mEq/L (ref 3.5–5.1)
Sodium: 139 mEq/L (ref 135–145)
Total Bilirubin: 0.6 mg/dL (ref 0.2–1.2)
Total Protein: 7.9 g/dL (ref 6.0–8.3)

## 2014-02-08 LAB — CBC WITH DIFFERENTIAL/PLATELET
BASOS ABS: 0 10*3/uL (ref 0.0–0.1)
BASOS PCT: 0.8 % (ref 0.0–3.0)
EOS ABS: 0 10*3/uL (ref 0.0–0.7)
Eosinophils Relative: 2.3 % (ref 0.0–5.0)
HCT: 38.5 % — ABNORMAL LOW (ref 39.0–52.0)
Hemoglobin: 13.4 g/dL (ref 13.0–17.0)
LYMPHS PCT: 58.7 % — AB (ref 12.0–46.0)
Lymphs Abs: 1.2 10*3/uL (ref 0.7–4.0)
MCHC: 34.9 g/dL (ref 30.0–36.0)
MCV: 103.9 fl — ABNORMAL HIGH (ref 78.0–100.0)
Monocytes Absolute: 0.2 10*3/uL (ref 0.1–1.0)
Monocytes Relative: 9 % (ref 3.0–12.0)
NEUTROS PCT: 29.2 % — AB (ref 43.0–77.0)
Neutro Abs: 0.6 10*3/uL — ABNORMAL LOW (ref 1.4–7.7)
Platelets: 134 10*3/uL — ABNORMAL LOW (ref 150.0–400.0)
RBC: 3.7 Mil/uL — AB (ref 4.22–5.81)
RDW: 13.9 % (ref 11.5–15.5)
WBC: 2.2 10*3/uL — ABNORMAL LOW (ref 4.0–10.5)

## 2014-02-08 LAB — TSH: TSH: 0.82 u[IU]/mL (ref 0.35–4.50)

## 2014-02-08 LAB — LIPID PANEL
Cholesterol: 195 mg/dL (ref 0–200)
HDL: 45 mg/dL (ref >39.00)
LDL Cholesterol: 130 mg/dL — ABNORMAL HIGH (ref 0–99)
NonHDL: 150
TRIGLYCERIDES: 98 mg/dL (ref 0.0–149.0)
Total CHOL/HDL Ratio: 4
VLDL: 19.6 mg/dL (ref 0.0–40.0)

## 2014-02-08 LAB — HEMOGLOBIN A1C: Hgb A1c MFr Bld: 5 % (ref 4.6–6.5)

## 2014-02-08 NOTE — Patient Instructions (Signed)

## 2014-02-08 NOTE — Progress Notes (Signed)
   Subjective:    Patient ID: Eugene Mccoy, male    DOB: Nov 23, 1975, 38 y.o.   MRN: [id number]  HPI Comments: He returns for a physical and he tells me that he feels well and offers no complaints.     Review of Systems  Constitutional: Negative.  Negative for fever, chills, diaphoresis, appetite change and fatigue.  HENT: Negative.   Eyes: Negative.   Respiratory: Negative.  Negative for cough, choking, chest tightness, shortness of breath and stridor.   Cardiovascular: Negative.  Negative for chest pain, palpitations and leg swelling.  Gastrointestinal: Negative.  Negative for abdominal pain.  Endocrine: Negative.   Genitourinary: Negative.  Negative for difficulty urinating.  Musculoskeletal: Negative.  Negative for back pain and joint swelling.  Skin: Negative.  Negative for rash.  Allergic/Immunologic: Negative.   Neurological: Negative.   Hematological: Negative.  Negative for adenopathy. Does not bruise/bleed easily.  Psychiatric/Behavioral: Negative.        Objective:   Physical Exam  Vitals reviewed. Constitutional: He is oriented to person, place, and time. He appears well-developed and well-nourished. No distress.  HENT:  Head: Normocephalic and atraumatic.  Mouth/Throat: Oropharynx is clear and moist. No oropharyngeal exudate.  Eyes: Conjunctivae are normal. Right eye exhibits no discharge. Left eye exhibits no discharge. No scleral icterus.  Neck: Normal range of motion. Neck supple. No JVD present. No tracheal deviation present. No thyromegaly present.  Cardiovascular: Normal rate, regular rhythm, normal heart sounds and intact distal pulses.  Exam reveals no gallop and no friction rub.   No murmur heard. Pulmonary/Chest: Effort normal and breath sounds normal. No stridor. No respiratory distress. He has no wheezes. He has no rales. He exhibits no tenderness.  Abdominal: Soft. Bowel sounds are normal. He exhibits no distension and no mass. There is no tenderness.  There is no rebound and no guarding. Hernia confirmed negative in the right inguinal area and confirmed negative in the left inguinal area.  Genitourinary: Penis normal. Right testis shows no mass, no swelling and no tenderness. Right testis is descended. Left testis shows no mass, no swelling and no tenderness. Left testis is descended. Circumcised. No penile erythema or penile tenderness. No discharge found.  Musculoskeletal: Normal range of motion. He exhibits no edema and no tenderness.  Lymphadenopathy:    He has no cervical adenopathy.       Right: No inguinal adenopathy present.       Left: No inguinal adenopathy present.  Neurological: He is oriented to person, place, and time.  Skin: Skin is warm and dry. No rash noted. He is not diaphoretic. No erythema. No pallor.  Psychiatric: He has a normal mood and affect. His behavior is normal. Judgment and thought content normal.     Lab Results  Component Value Date   WBC 2.3* 08/29/2013   HGB 12.9* 08/29/2013   HCT 37.7* 08/29/2013   PLT 123* 08/29/2013   GLUCOSE 77 02/28/2013   CHOL 191 10/05/2012   TRIG 153.0* 10/05/2012   HDL 46.40 10/05/2012   LDLCALC 114* 10/05/2012   ALT 22 02/28/2013   AST 21 02/28/2013   NA 139 02/28/2013   K 4.1 02/28/2013   CL 106 10/05/2012   CREATININE 1.1 02/28/2013   BUN 16.0 02/28/2013   CO2 21* 02/28/2013   TSH 0.88 10/05/2012   INR 1.03 12/25/2010   HGBA1C 5.1 11/07/2010       Assessment & Plan:

## 2014-02-12 ENCOUNTER — Encounter: Payer: Self-pay | Admitting: Internal Medicine

## 2014-02-12 NOTE — Assessment & Plan Note (Signed)
His LFT's are normal

## 2014-02-12 NOTE — Assessment & Plan Note (Signed)
His CBC is stable and he has no s/s related to this

## 2014-02-12 NOTE — Assessment & Plan Note (Signed)
His A1C is normal

## 2014-02-12 NOTE — Assessment & Plan Note (Addendum)

## 2014-03-06 ENCOUNTER — Other Ambulatory Visit (HOSPITAL_BASED_OUTPATIENT_CLINIC_OR_DEPARTMENT_OTHER): Payer: Medicare Other

## 2014-03-06 ENCOUNTER — Telehealth: Payer: Self-pay | Admitting: Oncology

## 2014-03-06 ENCOUNTER — Ambulatory Visit (HOSPITAL_BASED_OUTPATIENT_CLINIC_OR_DEPARTMENT_OTHER): Payer: Medicare Other | Admitting: Oncology

## 2014-03-06 VITALS — BP 122/79 | HR 64 | Temp 98.0°F | Resp 18 | Ht 69.0 in | Wt 212.0 lb

## 2014-03-06 DIAGNOSIS — D61818 Other pancytopenia: Secondary | ICD-10-CM

## 2014-03-06 DIAGNOSIS — D696 Thrombocytopenia, unspecified: Secondary | ICD-10-CM

## 2014-03-06 DIAGNOSIS — D72829 Elevated white blood cell count, unspecified: Secondary | ICD-10-CM

## 2014-03-06 LAB — COMPREHENSIVE METABOLIC PANEL (CC13)
ALT: 22 U/L (ref 0–55)
ANION GAP: 7 meq/L (ref 3–11)
AST: 23 U/L (ref 5–34)
Albumin: 4.3 g/dL (ref 3.5–5.0)
Alkaline Phosphatase: 64 U/L (ref 40–150)
BUN: 15.5 mg/dL (ref 7.0–26.0)
CALCIUM: 10 mg/dL (ref 8.4–10.4)
CHLORIDE: 107 meq/L (ref 98–109)
CO2: 27 meq/L (ref 22–29)
Creatinine: 1.1 mg/dL (ref 0.7–1.3)
Glucose: 84 mg/dl (ref 70–140)
Potassium: 4.5 mEq/L (ref 3.5–5.1)
SODIUM: 141 meq/L (ref 136–145)
Total Bilirubin: 0.48 mg/dL (ref 0.20–1.20)
Total Protein: 7.4 g/dL (ref 6.4–8.3)

## 2014-03-06 LAB — CBC WITH DIFFERENTIAL/PLATELET
BASO%: 1.4 % (ref 0.0–2.0)
Basophils Absolute: 0 10*3/uL (ref 0.0–0.1)
EOS%: 2.5 % (ref 0.0–7.0)
Eosinophils Absolute: 0.1 10*3/uL (ref 0.0–0.5)
HCT: 38 % — ABNORMAL LOW (ref 38.4–49.9)
HGB: 12.9 g/dL — ABNORMAL LOW (ref 13.0–17.1)
LYMPH#: 1.3 10*3/uL (ref 0.9–3.3)
LYMPH%: 51.8 % — ABNORMAL HIGH (ref 14.0–49.0)
MCH: 35.3 pg — ABNORMAL HIGH (ref 27.2–33.4)
MCHC: 33.9 g/dL (ref 32.0–36.0)
MCV: 104.2 fL — ABNORMAL HIGH (ref 79.3–98.0)
MONO#: 0.3 10*3/uL (ref 0.1–0.9)
MONO%: 10.9 % (ref 0.0–14.0)
NEUT#: 0.8 10*3/uL — ABNORMAL LOW (ref 1.5–6.5)
NEUT%: 33.4 % — ABNORMAL LOW (ref 39.0–75.0)
Platelets: 165 10*3/uL (ref 140–400)
RBC: 3.64 10*6/uL — AB (ref 4.20–5.82)
RDW: 13.7 % (ref 11.0–14.6)
WBC: 2.5 10*3/uL — AB (ref 4.0–10.3)

## 2014-03-06 NOTE — Telephone Encounter (Signed)
gv and printed appt sched and avs for pt for May 2016 °

## 2014-03-06 NOTE — Progress Notes (Signed)
Hematology and Oncology Follow Up Visit  JOMEL WHITTLESEY [id number] 08/27/1975 38 y.o. 03/06/2014 10:05 AM  CC: [name], MD   Principle Diagnosis: This is a 38 year old gentleman with lymphocytosis diagnosed in 11/2010. Work up has not shown any disease. He is S/P bone marrow biopsy and CT scans on 11/2010. All are with normal limit.   Interim History: Mr. Beagle presents today for a follow-up visit with his parents.  He continued to do well since his last visit.  He has not reported any fevers, has not reported any chills, has not reported any hospitalization and has not had any illnesses.  He continues to work at [employer].  He has reported occasional bruising but they are usually after trauma or heavy contact. He has not reported any headaches or blurry vision or double vision. Has not reported any chest pain or difficulty breathing. Has not reported any nausea or vomiting or abdominal pain. Has not reported any change in his bowel habits or urinary symptoms. Rest of his review of systems unremarkable.   Medications: I have reviewed the patient's current medications.  Current Outpatient Prescriptions  Medication Sig Dispense Refill  . azithromycin (ZITHROMAX) 250 MG tablet Take 2 pills by mouth on day 1, then 1 pill by mouth per day on days 2 through 5. 6 each 0  . clobetasol cream (TEMOVATE) 0.05 %   0  . pseudoephedrine-acetaminophen (TYLENOL SINUS) 30-500 MG TABS Take 1 tablet by mouth every 4 (four) hours as needed.    . terbinafine (LAMISIL) 250 MG tablet   0   No current facility-administered medications for this visit.      Allergies: No Known Allergies  Past Medical History, Surgical history, Social history, and Family History were reviewed and updated.  Review of Systems:  Remaining ROS negative.  Physical Exam: Blood pressure 122/79, pulse 64, temperature 98 F (36.7 C), temperature source Oral, resp. rate 18, height 5' 9"  (1.753 m), weight 212 lb (96.163  kg). ECOG: 0 General appearance: alert awake and no distress. Head: Normocephalic, without obvious abnormality, atraumatic Neck: no adenopathy Lymph nodes: Cervical, supraclavicular, and axillary nodes normal. Heart:regular rate and rhythm, S1, S2 normal, no murmur, click, rub or gallop Lung:chest clear, no wheezing, rales, normal symmetric air entry Abdomin: soft, non-tender, without masses or organomegaly EXT:no erythema, induration, or nodules   Lab Results: Lab Results  Component Value Date   WBC 2.5* 03/06/2014   HGB 12.9* 03/06/2014   HCT 38.0* 03/06/2014   MCV 104.2* 03/06/2014   PLT 165 03/06/2014     Chemistry      Component Value Date/Time   NA 139 02/08/2014 0919   NA 139 02/28/2013 0941   K 4.8 02/08/2014 0919   K 4.1 02/28/2013 0941   CL 105 02/08/2014 0919   CL 107 08/26/2012 0905   CO2 25 02/08/2014 0919   CO2 21* 02/28/2013 0941   BUN 17 02/08/2014 0919   BUN 16.0 02/28/2013 0941   CREATININE 1.2 02/08/2014 0919   CREATININE 1.1 02/28/2013 0941      Component Value Date/Time   CALCIUM 9.8 02/08/2014 0919   CALCIUM 10.2 02/28/2013 0941   ALKPHOS 56 02/08/2014 0919   ALKPHOS 57 02/28/2013 0941   AST 24 02/08/2014 0919   AST 21 02/28/2013 0941   ALT 28 02/08/2014 0919   ALT 22 02/28/2013 0941   BILITOT 0.6 02/08/2014 0919   BILITOT 0.55 02/28/2013 7612     38 year old gentleman with the following issues:  Impression and Plan: 1. Lymphocytosis and neutropenia.  The differential diagnosis discussed again today includeslymphoproliferative disorder,  MDS, LGL leukemia are all possibilities at this point.  Bone marrow biopsy in 12/25/2011 did not show any evidence of that. The etiology could be due to reactive process or autoimmune disorder as well. His counts although low, still stable at this point. Will continue to observe him and repeat bone marrow if he develops further count decline.He dose not need growth factor support at this time.   2. Anemia.   His hemoglobin is actually normal.  3. Follow-up will be in 6 months.   [email or web address],POLID, MD 11/10/201510:05 AM

## 2014-05-01 DIAGNOSIS — B351 Tinea unguium: Secondary | ICD-10-CM | POA: Diagnosis not present

## 2014-05-01 DIAGNOSIS — M79609 Pain in unspecified limb: Secondary | ICD-10-CM | POA: Diagnosis not present

## 2014-07-24 DIAGNOSIS — M79609 Pain in unspecified limb: Secondary | ICD-10-CM | POA: Diagnosis not present

## 2014-07-24 DIAGNOSIS — D2371 Other benign neoplasm of skin of right lower limb, including hip: Secondary | ICD-10-CM | POA: Diagnosis not present

## 2014-07-24 DIAGNOSIS — B351 Tinea unguium: Secondary | ICD-10-CM | POA: Diagnosis not present

## 2014-09-05 ENCOUNTER — Other Ambulatory Visit (HOSPITAL_BASED_OUTPATIENT_CLINIC_OR_DEPARTMENT_OTHER): Payer: Medicare Other

## 2014-09-05 ENCOUNTER — Telehealth: Payer: Self-pay | Admitting: Oncology

## 2014-09-05 ENCOUNTER — Ambulatory Visit (HOSPITAL_BASED_OUTPATIENT_CLINIC_OR_DEPARTMENT_OTHER): Payer: Medicare Other | Admitting: Oncology

## 2014-09-05 VITALS — BP 112/64 | HR 62 | Temp 97.7°F | Resp 183 | Ht 69.0 in | Wt 212.5 lb

## 2014-09-05 DIAGNOSIS — D709 Neutropenia, unspecified: Secondary | ICD-10-CM

## 2014-09-05 DIAGNOSIS — D61818 Other pancytopenia: Secondary | ICD-10-CM

## 2014-09-05 DIAGNOSIS — D72820 Lymphocytosis (symptomatic): Secondary | ICD-10-CM | POA: Diagnosis not present

## 2014-09-05 LAB — CBC WITH DIFFERENTIAL/PLATELET
BASO%: 0.9 % (ref 0.0–2.0)
Basophils Absolute: 0 10*3/uL (ref 0.0–0.1)
EOS%: 2.7 % (ref 0.0–7.0)
Eosinophils Absolute: 0.1 10*3/uL (ref 0.0–0.5)
HEMATOCRIT: 37.7 % — AB (ref 38.4–49.9)
HEMOGLOBIN: 13 g/dL (ref 13.0–17.1)
LYMPH%: 60.8 % — AB (ref 14.0–49.0)
MCH: 36.1 pg — ABNORMAL HIGH (ref 27.2–33.4)
MCHC: 34.6 g/dL (ref 32.0–36.0)
MCV: 104.2 fL — ABNORMAL HIGH (ref 79.3–98.0)
MONO#: 0.2 10*3/uL (ref 0.1–0.9)
MONO%: 9.5 % (ref 0.0–14.0)
NEUT%: 26.1 % — ABNORMAL LOW (ref 39.0–75.0)
NEUTROS ABS: 0.6 10*3/uL — AB (ref 1.5–6.5)
PLATELETS: 141 10*3/uL (ref 140–400)
RBC: 3.62 10*6/uL — AB (ref 4.20–5.82)
RDW: 13.6 % (ref 11.0–14.6)
WBC: 2.2 10*3/uL — ABNORMAL LOW (ref 4.0–10.3)
lymph#: 1.3 10*3/uL (ref 0.9–3.3)

## 2014-09-05 LAB — COMPREHENSIVE METABOLIC PANEL (CC13)
ALT: 17 U/L (ref 0–55)
AST: 20 U/L (ref 5–34)
Albumin: 4.2 g/dL (ref 3.5–5.0)
Alkaline Phosphatase: 59 U/L (ref 40–150)
Anion Gap: 11 mEq/L (ref 3–11)
BUN: 14 mg/dL (ref 7.0–26.0)
CO2: 22 mEq/L (ref 22–29)
Calcium: 9.3 mg/dL (ref 8.4–10.4)
Chloride: 109 mEq/L (ref 98–109)
Creatinine: 1.2 mg/dL (ref 0.7–1.3)
EGFR: 87 mL/min/{1.73_m2} — AB (ref >90)
GLUCOSE: 84 mg/dL (ref 70–140)
Potassium: 4.1 mEq/L (ref 3.5–5.1)
SODIUM: 142 meq/L (ref 136–145)
TOTAL PROTEIN: 7.1 g/dL (ref 6.4–8.3)
Total Bilirubin: 0.41 mg/dL (ref 0.20–1.20)

## 2014-09-05 NOTE — Progress Notes (Signed)
Hematology and Oncology Follow Up Visit  BRADY SCHILLER [id number] [birth year] 39 y.o. 09/05/2014 9:39 AM  CC: [name], MD   Principle Diagnosis: This is a 39 year old gentleman with neutropenia and lymphocytosis diagnosed in 11/2010. Work up has not shown any disease. He is S/P bone marrow biopsy and CT scans on 11/2010. All are with normal limit.   Interim History: Mr. Arnesen presents today for a follow-up visit with his parents. Since his last visit, he continues to do very well.  He has not reported any fevers, has not reported any chills, has not reported any hospitalization and has not had any illnesses. He does not report any cold or flu like symptoms. He continues to work at [employer] without any decline. He has reported occasional bruising but they are usually after trauma or heavy contact. He has not reported any headaches or blurry vision or double vision. Has not reported any chest pain or difficulty breathing. Has not reported any nausea or vomiting or abdominal pain. Has not reported any change in his bowel habits or urinary symptoms. Rest of his review of systems unremarkable.   Medications: I have reviewed the patient's current medications.  Current Outpatient Prescriptions  Medication Sig Dispense Refill  . azithromycin (ZITHROMAX) 250 MG tablet Take 2 pills by mouth on day 1, then 1 pill by mouth per day on days 2 through 5. 6 each 0  . clobetasol cream (TEMOVATE) 0.05 %   0  . pseudoephedrine-acetaminophen (TYLENOL SINUS) 30-500 MG TABS Take 1 tablet by mouth every 4 (four) hours as needed.    . terbinafine (LAMISIL) 250 MG tablet   0   No current facility-administered medications for this visit.      Allergies: No Known Allergies  Past Medical History, Surgical history, Social history, and Family History were reviewed and updated.    Physical Exam: Blood pressure 112/64, pulse 62, temperature 97.7 F (36.5 C), temperature source Oral, resp. rate 183,  height _0  (1.753 m), weight 212 lb 8 oz (96.389 kg), SpO2 100 %. ECOG: 0 General appearance: alert awake and no distress. Head: Normocephalic, without obvious abnormality, atraumatic Neck: no adenopathy Lymph nodes: Cervical, supraclavicular, and axillary nodes normal. Heart:regular rate and rhythm, S1, S2 normal, no murmur, click, rub or gallop Lung:chest clear, no wheezing, rales, normal symmetric air entry Abdomin: soft, non-tender, without masses or organomegaly EXT:no erythema, induration, or nodules   Lab Results: Lab Results  Component Value Date   WBC 2.2* 09/05/2014   HGB 13.0 09/05/2014   HCT 37.7* 09/05/2014   MCV 104.2* 09/05/2014   PLT 141 09/05/2014     Chemistry      Component Value Date/Time   NA 141 03/06/2014 0938   NA 139 02/08/2014 0919   K 4.5 03/06/2014 0938   K 4.8 02/08/2014 0919   CL 105 02/08/2014 0919   CL 107 08/26/2012 0905   CO2 27 03/06/2014 0938   CO2 25 02/08/2014 0919   BUN 15.5 03/06/2014 0938   BUN 17 02/08/2014 0919   CREATININE 1.1 03/06/2014 0938   CREATININE 1.2 02/08/2014 0919      Component Value Date/Time   CALCIUM 10.0 03/06/2014 0938   CALCIUM 9.8 02/08/2014 0919   ALKPHOS 64 03/06/2014 0938   ALKPHOS 56 02/08/2014 0919   AST 23 03/06/2014 0938   AST 24 02/08/2014 0919   ALT 22 03/06/2014 0938   ALT 28 02/08/2014 0919   BILITOT 0.48 03/06/2014 0938   BILITOT 0.6 02/08/2014  3240     39 year old gentleman with the following issues:  Impression and Plan: 1. Lymphocytosis and neutropenia.  The differential diagnosis includes lymphoproliferative disorder,  MDS, LGL leukemia are all possibilities at this point.  Bone marrow biopsy in 12/25/2011 did not show any evidence of that. The etiology could be due to reactive process or autoimmune disorder as well. His counts although low, still stable at this point for the last 4 years. Will continue to observe him and repeat bone marrow if he develops further count decline.He dose  not need growth factor support at this time.   2. Anemia.  His hemoglobin is actually normal at this time. 3. Follow-up will be in 6 months.   [facility], MD 5/11/20169:39 AM

## 2014-09-05 NOTE — Telephone Encounter (Signed)
Pt confirmed labs/ov per 05/11 POF, gave pt AVS and Calendar.... KJ °

## 2014-09-05 NOTE — Addendum Note (Signed)
Addended by: Randolm Idol on: 09/05/2014 09:49 AM   Modules accepted: Medications

## 2014-10-16 DIAGNOSIS — L03049 Acute lymphangitis of unspecified toe: Secondary | ICD-10-CM | POA: Diagnosis not present

## 2014-10-16 DIAGNOSIS — M79609 Pain in unspecified limb: Secondary | ICD-10-CM | POA: Diagnosis not present

## 2015-03-07 ENCOUNTER — Ambulatory Visit (HOSPITAL_BASED_OUTPATIENT_CLINIC_OR_DEPARTMENT_OTHER): Payer: Medicare Other | Admitting: Oncology

## 2015-03-07 ENCOUNTER — Telehealth: Payer: Self-pay | Admitting: Oncology

## 2015-03-07 ENCOUNTER — Other Ambulatory Visit (HOSPITAL_BASED_OUTPATIENT_CLINIC_OR_DEPARTMENT_OTHER): Payer: Medicare Other

## 2015-03-07 VITALS — BP 111/66 | HR 76 | Temp 97.6°F | Resp 18 | Ht 69.0 in | Wt 213.8 lb

## 2015-03-07 DIAGNOSIS — D7589 Other specified diseases of blood and blood-forming organs: Secondary | ICD-10-CM | POA: Diagnosis not present

## 2015-03-07 DIAGNOSIS — D61818 Other pancytopenia: Secondary | ICD-10-CM

## 2015-03-07 DIAGNOSIS — D696 Thrombocytopenia, unspecified: Secondary | ICD-10-CM

## 2015-03-07 DIAGNOSIS — D72820 Lymphocytosis (symptomatic): Secondary | ICD-10-CM

## 2015-03-07 LAB — COMPREHENSIVE METABOLIC PANEL (CC13)
ALK PHOS: 68 U/L (ref 40–150)
ALT: 24 U/L (ref 0–55)
AST: 19 U/L (ref 5–34)
Albumin: 4.1 g/dL (ref 3.5–5.0)
Anion Gap: 8 mEq/L (ref 3–11)
BUN: 13.8 mg/dL (ref 7.0–26.0)
CALCIUM: 10 mg/dL (ref 8.4–10.4)
CO2: 25 mEq/L (ref 22–29)
Chloride: 108 mEq/L (ref 98–109)
Creatinine: 1.1 mg/dL (ref 0.7–1.3)
EGFR: >90 mL/min/{1.73_m2} (ref >90)
GLUCOSE: 76 mg/dL (ref 70–140)
POTASSIUM: 4 meq/L (ref 3.5–5.1)
SODIUM: 141 meq/L (ref 136–145)
Total Bilirubin: 0.48 mg/dL (ref 0.20–1.20)
Total Protein: 7.2 g/dL (ref 6.4–8.3)

## 2015-03-07 LAB — CBC WITH DIFFERENTIAL/PLATELET
BASO%: 0.8 % (ref 0.0–2.0)
Basophils Absolute: 0 10*3/uL (ref 0.0–0.1)
EOS ABS: 0.1 10*3/uL (ref 0.0–0.5)
EOS%: 2.6 % (ref 0.0–7.0)
HCT: 39 % (ref 38.4–49.9)
HEMOGLOBIN: 13.3 g/dL (ref 13.0–17.1)
LYMPH%: 49.3 % — ABNORMAL HIGH (ref 14.0–49.0)
MCH: 35.9 pg — ABNORMAL HIGH (ref 27.2–33.4)
MCHC: 34.2 g/dL (ref 32.0–36.0)
MCV: 105.1 fL — AB (ref 79.3–98.0)
MONO#: 0.2 10*3/uL (ref 0.1–0.9)
MONO%: 8.4 % (ref 0.0–14.0)
NEUT#: 1.1 10*3/uL — ABNORMAL LOW (ref 1.5–6.5)
NEUT%: 38.9 % — ABNORMAL LOW (ref 39.0–75.0)
Platelets: 147 10*3/uL (ref 140–400)
RBC: 3.71 10*6/uL — ABNORMAL LOW (ref 4.20–5.82)
RDW: 13.9 % (ref 11.0–14.6)
WBC: 2.9 10*3/uL — ABNORMAL LOW (ref 4.0–10.3)
lymph#: 1.4 10*3/uL (ref 0.9–3.3)

## 2015-03-07 NOTE — Progress Notes (Signed)
Hematology and Oncology Follow Up Visit  RALPHAEL SOUTHGATE [id number] 1975/09/18 39 y.o. 03/07/2015 9:59 AM  CC: [name], MD   Principle Diagnosis: This is a 39 year old gentleman with neutropenia and lymphocytosis diagnosed in 11/2010. Work up has not shown any disease. He is S/P bone marrow biopsy and CT scans on 11/2010. All are with normal limit.   Current therapy: Observation and surveillance.  Interim History: Mr. Spinella presents today for a follow-up visit with his father. Since his last visit, he reports no complaints. He continues to work part time without any decline in his energy her performance status.   He has not reported any fevers, has not reported any chills, has not reported any hospitalization and has not had any illnesses. He does not report any cold or flu like symptoms.    He has not reported any headaches or blurry vision or double vision. Has not reported any chest pain or difficulty breathing. Has not reported any nausea or vomiting or abdominal pain. Has not reported any change in his bowel habits or urinary symptoms. Rest of his review of systems unremarkable.   Medications: I have reviewed the patient's current medications.  Current Outpatient Prescriptions  Medication Sig Dispense Refill  . pseudoephedrine-acetaminophen (TYLENOL SINUS) 30-500 MG TABS Take 1 tablet by mouth every 4 (four) hours as needed.     No current facility-administered medications for this visit.      Allergies: No Known Allergies  Past Medical History, Surgical history, Social history, and Family History were reviewed and updated.    Physical Exam: Blood pressure 111/66, pulse 76, temperature 97.6 F (36.4 C), temperature source Oral, resp. rate 18, height _0  (1.753 m), weight 213 lb 12.8 oz (96.979 kg), SpO2 100 %. ECOG: 0 General appearance: alert oriented gentleman without distress. Head: Normocephalic, without obvious abnormality oral mucosa is moist and pink. Neck: no  adenopathy Lymph nodes: Cervical, supraclavicular, and axillary nodes normal. Heart:regular rate and rhythm, S1, S2 normal, no murmur, click, rub or gallop Lung:chest clear, no wheezing, rales, normal symmetric air entry Abdomin: soft, non-tender, without masses or organomegaly EXT:no erythema, induration, or nodules   Lab Results: Lab Results  Component Value Date   WBC 2.9* 03/07/2015   HGB 13.3 03/07/2015   HCT 39.0 03/07/2015   MCV 105.1* 03/07/2015   PLT 147 03/07/2015     Chemistry      Component Value Date/Time   NA 142 09/05/2014 0914   NA 139 02/08/2014 0919   K 4.1 09/05/2014 0914   K 4.8 02/08/2014 0919   CL 105 02/08/2014 0919   CL 107 08/26/2012 0905   CO2 22 09/05/2014 0914   CO2 25 02/08/2014 0919   BUN 14.0 09/05/2014 0914   BUN 17 02/08/2014 0919   CREATININE 1.2 09/05/2014 0914   CREATININE 1.2 02/08/2014 0919      Component Value Date/Time   CALCIUM 9.3 09/05/2014 0914   CALCIUM 9.8 02/08/2014 0919   ALKPHOS 59 09/05/2014 0914   ALKPHOS 56 02/08/2014 0919   AST 20 09/05/2014 0914   AST 24 02/08/2014 0919   ALT 17 09/05/2014 0914   ALT 28 02/08/2014 0919   BILITOT 0.41 09/05/2014 0914   BILITOT 0.6 02/08/2014 4389     39 year old gentleman with the following issues:  Impression and Plan: 1. Lymphocytosis and neutropenia.  The differential diagnosis includes lymphoproliferative disorder,  MDS, LGL leukemia are all possibilities at this point.  Bone marrow biopsy in 12/25/2011 did not show any  evidence of that. The etiology could be due to reactive process or autoimmune disorder as well. His laboratory testing from today was reviewed and discussed with the patient and his family. His white cell count is actually improving with absolute neutrophil count that is over 1000. For the time being we'll continue with observation and surveillance and repeat staging workup including a bone marrow biopsy her CT scan if there is any dramatic changes in his counts  or clinical status.   2. Macrocytosis without anemia: Unclear etiology at this time could be early myelodysplasia but his bone marrow biopsy did not support that. This can be repeated in the future if other abnormalities is noted. 3. Follow-up will be in 6 months.   IRSWNI,OEVOJ, MD 11/10/20169:59 AM

## 2015-03-07 NOTE — Telephone Encounter (Signed)
per pof to sch pt appt-gave pt copy of avs °

## 2015-07-18 ENCOUNTER — Encounter: Payer: Self-pay | Admitting: Internal Medicine

## 2015-07-18 ENCOUNTER — Other Ambulatory Visit (INDEPENDENT_AMBULATORY_CARE_PROVIDER_SITE_OTHER): Payer: Medicare Other

## 2015-07-18 ENCOUNTER — Ambulatory Visit (INDEPENDENT_AMBULATORY_CARE_PROVIDER_SITE_OTHER): Payer: Medicare Other | Admitting: Internal Medicine

## 2015-07-18 VITALS — BP 120/78 | HR 65 | Temp 98.1°F | Resp 16 | Ht 69.0 in | Wt 214.0 lb

## 2015-07-18 DIAGNOSIS — E785 Hyperlipidemia, unspecified: Secondary | ICD-10-CM

## 2015-07-18 DIAGNOSIS — Z0000 Encounter for general adult medical examination without abnormal findings: Secondary | ICD-10-CM | POA: Diagnosis not present

## 2015-07-18 LAB — LIPID PANEL
CHOL/HDL RATIO: 4
Cholesterol: 192 mg/dL (ref 0–200)
HDL: 47.3 mg/dL (ref >39.00)
LDL Cholesterol: 107 mg/dL — ABNORMAL HIGH (ref 0–99)
NONHDL: 144.6
Triglycerides: 189 mg/dL — ABNORMAL HIGH (ref 0.0–149.0)
VLDL: 37.8 mg/dL (ref 0.0–40.0)

## 2015-07-18 LAB — TSH: TSH: 0.76 u[IU]/mL (ref 0.35–4.50)

## 2015-07-18 NOTE — Patient Instructions (Signed)

## 2015-07-18 NOTE — Progress Notes (Signed)
Subjective:  Patient ID: Eugene Mccoy, male    DOB: 04/28/1975  Age: 40 y.o. MRN: [id number]  CC: Annual Exam and Hyperlipidemia   HPI KALEB BOEDEKER presents for a complete physical as well as follow-up on history of moderately elevated LDL and high triglycerides. The patient is noncommunicative so his dad offers most of the information and says that he is doing well. He works at [employer] and there've been no signs or symptoms of complications or concerns recently.  Outpatient Prescriptions Prior to Visit  Medication Sig Dispense Refill  . pseudoephedrine-acetaminophen (TYLENOL SINUS) 30-500 MG TABS Take 1 tablet by mouth every 4 (four) hours as needed.     No facility-administered medications prior to visit.    ROS Review of Systems  Constitutional: Negative for activity change, appetite change and unexpected weight change.  HENT: Negative.   Respiratory: Negative for cough and shortness of breath.   Cardiovascular: Negative for palpitations and leg swelling.  Gastrointestinal: Negative.  Negative for nausea, vomiting, abdominal pain, diarrhea, constipation and blood in stool.  Endocrine: Negative.   Genitourinary: Negative for difficulty urinating.  Musculoskeletal: Negative.   Skin: Negative.  Negative for color change and rash.  Allergic/Immunologic: Negative.   Neurological: Negative.   Hematological: Negative.   Psychiatric/Behavioral: Negative.   All other systems reviewed and are negative.   Objective:  BP 120/78 mmHg  Pulse 65  Temp(Src) 98.1 F (36.7 C) (Oral)  Resp 16  Ht 5\' 9"  (1.753 m)  Wt 214 lb (97.07 kg)  BMI 31.59 kg/m2  SpO2 97%  BP Readings from Last 3 Encounters:  07/18/15 120/78  03/07/15 111/66  09/05/14 112/64    Wt Readings from Last 3 Encounters:  07/18/15 214 lb (97.07 kg)  03/07/15 213 lb 12.8 oz (96.979 kg)  09/05/14 212 lb 8 oz (96.389 kg)    Physical Exam  Constitutional: He is oriented to person, place, and time. No  distress.  HENT:  Head: Normocephalic and atraumatic.  Mouth/Throat: Oropharynx is clear and moist. No oropharyngeal exudate.  Eyes: Conjunctivae are normal. Right eye exhibits no discharge. Left eye exhibits no discharge. No scleral icterus.  Neck: Normal range of motion. Neck supple. No JVD present. No tracheal deviation present. No thyromegaly present.  Cardiovascular: Normal rate, regular rhythm, normal heart sounds and intact distal pulses.  Exam reveals no gallop and no friction rub.   No murmur heard. Pulmonary/Chest: Effort normal and breath sounds normal. No stridor. No respiratory distress. He has no wheezes. He has no rales. He exhibits no tenderness.  Abdominal: Soft. Bowel sounds are normal. He exhibits no distension and no mass. There is no tenderness. There is no rebound and no guarding. Hernia confirmed negative in the right inguinal area.  Genitourinary: Rectum normal, prostate normal, testes normal and penis normal. Rectal exam shows no external hemorrhoid, no internal hemorrhoid, no fissure, no mass, no tenderness and anal tone normal. Guaiac negative stool. Prostate is not enlarged and not tender. Right testis shows no mass, no swelling and no tenderness. Right testis is descended. Left testis shows no mass, no swelling and no tenderness. Left testis is descended. Circumcised. No penile erythema or penile tenderness. No discharge found.  There is mild, bilateral, testicular atrophy.  Musculoskeletal: Normal range of motion. He exhibits no edema or tenderness.  Lymphadenopathy:    He has no cervical adenopathy.       Right: No inguinal adenopathy present.       Left: No inguinal adenopathy  present.  Neurological: He is oriented to person, place, and time.  Skin: Skin is warm and dry. No rash noted. He is not diaphoretic. No erythema. No pallor.  Psychiatric: He has a normal mood and affect. His behavior is normal. Judgment and thought content normal.  Vitals reviewed.   Lab  Results  Component Value Date   WBC 2.9* 03/07/2015   HGB 13.3 03/07/2015   HCT 39.0 03/07/2015   PLT 147 03/07/2015   GLUCOSE 76 03/07/2015   CHOL 192 07/18/2015   TRIG 189.0* 07/18/2015   HDL 47.30 07/18/2015   LDLCALC 107* 07/18/2015   ALT 24 03/07/2015   AST 19 03/07/2015   NA 141 03/07/2015   K 4.0 03/07/2015   CL 105 02/08/2014   CREATININE 1.1 03/07/2015   BUN 13.8 03/07/2015   CO2 25 03/07/2015   TSH 0.76 07/18/2015   INR 1.03 12/25/2010   HGBA1C 5.0 02/08/2014    US Abdomen Complete  10/14/2012  *RADIOLOGY REPORT* Clinical Data:  Elevated liver enzymes. ABDOMEN ULTRASOUND Technique:  Complete abdominal ultrasound examination was performed including evaluation of the liver, gallbladder, bile ducts, pancreas, kidneys, spleen, IVC, and abdominal aorta. Comparison:  CT of the abdomen on 12/17/2010. Findings: Gallbladder:  No shadowing gallstones are identified.  Minimal echogenicity within the gallbladder may be consistent with a small amount of biliary sludge.  There is no evidence of gallbladder distention, wall thickening or pericholecystic fluid.  No sonographic Murphy's sign was elicited. Common Bile Duct:  Normal caliber of 3 mm. Liver:  No focal mass lesion seen.  Within normal limits in parenchymal echogenicity. No biliary ductal dilatation identified. IVC:  Very limited visualization of the inferior vena cava. Pancreas:  The pancreas is essentially nonvisualized by ultrasound. Spleen:  The spleen is of normal echotexture and size. Kidneys:  Both kidneys measure approximately 9.6 cm and show no evidence of hydronephrosis or focal lesion. Abdominal Aorta:  The abdominal aorta is poorly visualized. Proximal to mid aorta demonstrate normal caliber. IMPRESSION: Unremarkable abdominal ultrasound.  There may be a small amount of biliary sludge in the gallbladder. Original Report Authenticated By: Aletta Edouard, M.D.    Assessment & Plan:   Chance was seen today for annual exam  and hyperlipidemia.  Diagnoses and all orders for this visit:  Routine general medical examination at a health care facility- flu vaccine was refused today, recent labs were reviewed and all appear normal, exam completed, additional labs ordered today, patient education material was given.  Hyperlipidemia with target LDL less than 130- his triglycerides remain mildly elevated though he was not fasting today. This elevation is not high enough to require treatment. We'll continue to monitor. -     Lipid panel; Future -     TSH; Future   I have discontinued Mr. Laurich pseudoephedrine-acetaminophen.  No orders of the defined types were placed in this encounter.   See AVS for instructions about healthy living and anticipatory guidance.  Follow-up: Return in about 1 year (around 07/17/2016).  [name], MD

## 2015-07-18 NOTE — Progress Notes (Signed)
Pre visit review using our clinic review tool, if applicable. No additional management support is needed unless otherwise documented below in the visit note. (sarah self, [employer] student  documented this record)  

## 2015-10-08 ENCOUNTER — Ambulatory Visit (HOSPITAL_BASED_OUTPATIENT_CLINIC_OR_DEPARTMENT_OTHER): Payer: Medicare Other | Admitting: Oncology

## 2015-10-08 ENCOUNTER — Telehealth: Payer: Self-pay | Admitting: Oncology

## 2015-10-08 ENCOUNTER — Other Ambulatory Visit (HOSPITAL_BASED_OUTPATIENT_CLINIC_OR_DEPARTMENT_OTHER): Payer: Medicare Other

## 2015-10-08 VITALS — BP 115/61 | HR 66 | Temp 97.8°F | Resp 18 | Wt 210.0 lb

## 2015-10-08 DIAGNOSIS — D61818 Other pancytopenia: Secondary | ICD-10-CM

## 2015-10-08 DIAGNOSIS — D72820 Lymphocytosis (symptomatic): Secondary | ICD-10-CM | POA: Diagnosis not present

## 2015-10-08 DIAGNOSIS — D709 Neutropenia, unspecified: Secondary | ICD-10-CM | POA: Diagnosis not present

## 2015-10-08 DIAGNOSIS — D7589 Other specified diseases of blood and blood-forming organs: Secondary | ICD-10-CM | POA: Diagnosis not present

## 2015-10-08 LAB — COMPREHENSIVE METABOLIC PANEL
ALT: 26 U/L (ref 0–55)
AST: 24 U/L (ref 5–34)
Albumin: 4.6 g/dL (ref 3.5–5.0)
Alkaline Phosphatase: 55 U/L (ref 40–150)
Anion Gap: 9 mEq/L (ref 3–11)
BUN: 14.7 mg/dL (ref 7.0–26.0)
CALCIUM: 10.1 mg/dL (ref 8.4–10.4)
CHLORIDE: 105 meq/L (ref 98–109)
CO2: 25 meq/L (ref 22–29)
CREATININE: 1.3 mg/dL (ref 0.7–1.3)
EGFR: 80 mL/min/{1.73_m2} — ABNORMAL LOW (ref >90)
GLUCOSE: 84 mg/dL (ref 70–140)
Potassium: 4.3 mEq/L (ref 3.5–5.1)
Sodium: 139 mEq/L (ref 136–145)
Total Bilirubin: 0.62 mg/dL (ref 0.20–1.20)
Total Protein: 7.9 g/dL (ref 6.4–8.3)

## 2015-10-08 LAB — CBC WITH DIFFERENTIAL/PLATELET
BASO%: 1.2 % (ref 0.0–2.0)
Basophils Absolute: 0 10*3/uL (ref 0.0–0.1)
EOS ABS: 0.1 10*3/uL (ref 0.0–0.5)
EOS%: 2.9 % (ref 0.0–7.0)
HCT: 39.6 % (ref 38.4–49.9)
HGB: 13.5 g/dL (ref 13.0–17.1)
LYMPH%: 57.5 % — AB (ref 14.0–49.0)
MCH: 35.7 pg — ABNORMAL HIGH (ref 27.2–33.4)
MCHC: 34 g/dL (ref 32.0–36.0)
MCV: 105.1 fL — ABNORMAL HIGH (ref 79.3–98.0)
MONO#: 0.2 10*3/uL (ref 0.1–0.9)
MONO%: 8 % (ref 0.0–14.0)
NEUT%: 30.4 % — ABNORMAL LOW (ref 39.0–75.0)
NEUTROS ABS: 0.7 10*3/uL — AB (ref 1.5–6.5)
Platelets: 147 10*3/uL (ref 140–400)
RBC: 3.77 10*6/uL — ABNORMAL LOW (ref 4.20–5.82)
RDW: 14.1 % (ref 11.0–14.6)
WBC: 2.4 10*3/uL — AB (ref 4.0–10.3)
lymph#: 1.4 10*3/uL (ref 0.9–3.3)

## 2015-10-08 NOTE — Progress Notes (Signed)
Hematology and Oncology Follow Up Visit  Eugene Mccoy [id number] 10/09/1975 40 y.o. 10/08/2015 10:30 AM  CC: [name], MD   Principle Diagnosis: This is a 40 year old gentleman with neutropenia and lymphocytosis diagnosed in 11/2010. Work up has not shown any disease. He is S/P bone marrow biopsy and CT scans on 11/2010. All are with normal limit.   Current therapy: Observation and surveillance.  Interim History: Mr. Binette presents today for a follow-up visit with his Family. Since his last visit, he reports no changes in his health. He denied any recent hospitalization or illnesses. He denied any constitutional symptoms of fevers or chills or sweats. He continues to work part time without any decline in his energy her performance status. He denied any recurrent infections such as cellulitis, sinusitis or urinary tract infections. He did report intentional weight loss close to 3 pounds.   He has not reported any headaches or blurry vision or double vision. Has not reported any chest pain or difficulty breathing. He does not report any cough, wheezing or hemoptysis. Has not reported any nausea or vomiting or abdominal pain. Has not reported any change in his bowel habits or urinary symptoms. Rest of his review of systems unremarkable.   Medications: I have reviewed the patient's current medications.  No current outpatient prescriptions on file.   No current facility-administered medications for this visit.      Allergies: No Known Allergies  Past Medical History, Surgical history, Social history, and Family History were reviewed and updated.    Physical Exam: Blood pressure 115/61, pulse 66, temperature 97.8 F (36.6 C), temperature source Oral, resp. rate 18, weight 210 lb (95.255 kg), SpO2 100 %. ECOG: 0 General appearance: Pleasant-appearing gentleman without distress. Head: Normocephalic, without obvious abnormality oral mucosa without ulcers or lesions. Neck: no  adenopathy Lymph nodes: Cervical, supraclavicular, and axillary nodes normal. Heart:regular rate and rhythm, S1, S2 normal, no murmur, click, rub or gallop Lung:chest clear, no wheezing, rales, normal symmetric air entry no dullness to percussion. Abdomin: soft, non-tender, without masses or organomegaly no shifting dullness or ascites. EXT:no erythema, induration, or nodules   Lab Results: Lab Results  Component Value Date   WBC 2.4* 10/08/2015   HGB 13.5 10/08/2015   HCT 39.6 10/08/2015   MCV 105.1* 10/08/2015   PLT 147 10/08/2015     Chemistry      Component Value Date/Time   NA 141 03/07/2015 0929   NA 139 02/08/2014 0919   K 4.0 03/07/2015 0929   K 4.8 02/08/2014 0919   CL 105 02/08/2014 0919   CL 107 08/26/2012 0905   CO2 25 03/07/2015 0929   CO2 25 02/08/2014 0919   BUN 13.8 03/07/2015 0929   BUN 17 02/08/2014 0919   CREATININE 1.1 03/07/2015 0929   CREATININE 1.2 02/08/2014 0919      Component Value Date/Time   CALCIUM 10.0 03/07/2015 0929   CALCIUM 9.8 02/08/2014 0919   ALKPHOS 68 03/07/2015 0929   ALKPHOS 56 02/08/2014 0919   AST 19 03/07/2015 0929   AST 24 02/08/2014 0919   ALT 24 03/07/2015 0929   ALT 28 02/08/2014 0919   BILITOT 0.48 03/07/2015 0929   BILITOT 0.6 02/08/2014 172     40 year old gentleman with the following issues:  Impression and Plan: 1. Lymphocytosis and neutropenia.  The differential diagnosis includes Congenital an autoimmune findings as well as lymphoproliferative disorder.  MDS, LGL leukemia are all possibilities at this point.  Bone marrow biopsy in 12/25/2010 did  not show any evidence of that. He continues to be asymptomatic without any decline in his other counts. The plan is to continue with observation and surveillance and repeat bone marrow biopsy and a CT scan if there is any new complaints or laboratory abnormalities.   2. Macrocytosis without anemia: Unclear etiology at this time could be early myelodysplasia but his bone  marrow biopsy did not support that. We'll continue to monitor this periodically. 3. Follow-up will be in 9 months.   [facility], MD 6/13/201710:30 AM

## 2015-10-08 NOTE — Telephone Encounter (Signed)
Gave pt apt & avs °

## 2016-07-07 ENCOUNTER — Other Ambulatory Visit (HOSPITAL_BASED_OUTPATIENT_CLINIC_OR_DEPARTMENT_OTHER): Payer: Medicare Other

## 2016-07-07 ENCOUNTER — Ambulatory Visit (HOSPITAL_BASED_OUTPATIENT_CLINIC_OR_DEPARTMENT_OTHER): Payer: Medicare Other | Admitting: Oncology

## 2016-07-07 ENCOUNTER — Telehealth: Payer: Self-pay | Admitting: Oncology

## 2016-07-07 VITALS — BP 123/71 | HR 62 | Temp 97.6°F | Resp 20 | Wt 218.4 lb

## 2016-07-07 DIAGNOSIS — D709 Neutropenia, unspecified: Secondary | ICD-10-CM

## 2016-07-07 DIAGNOSIS — D72820 Lymphocytosis (symptomatic): Secondary | ICD-10-CM | POA: Diagnosis not present

## 2016-07-07 DIAGNOSIS — D649 Anemia, unspecified: Secondary | ICD-10-CM | POA: Diagnosis not present

## 2016-07-07 DIAGNOSIS — D61818 Other pancytopenia: Secondary | ICD-10-CM

## 2016-07-07 LAB — CBC WITH DIFFERENTIAL/PLATELET
BASO%: 0.9 % (ref 0.0–2.0)
Basophils Absolute: 0 10*3/uL (ref 0.0–0.1)
EOS ABS: 0.1 10*3/uL (ref 0.0–0.5)
EOS%: 2.7 % (ref 0.0–7.0)
HCT: 38.9 % (ref 38.4–49.9)
HEMOGLOBIN: 13.3 g/dL (ref 13.0–17.1)
LYMPH%: 53.9 % — ABNORMAL HIGH (ref 14.0–49.0)
MCH: 36.6 pg — ABNORMAL HIGH (ref 27.2–33.4)
MCHC: 34.3 g/dL (ref 32.0–36.0)
MCV: 106.8 fL — AB (ref 79.3–98.0)
MONO#: 0.3 10*3/uL (ref 0.1–0.9)
MONO%: 12.2 % (ref 0.0–14.0)
NEUT%: 30.3 % — ABNORMAL LOW (ref 39.0–75.0)
NEUTROS ABS: 0.7 10*3/uL — AB (ref 1.5–6.5)
PLATELETS: 144 10*3/uL (ref 140–400)
RBC: 3.64 10*6/uL — ABNORMAL LOW (ref 4.20–5.82)
RDW: 14.4 % (ref 11.0–14.6)
WBC: 2.4 10*3/uL — AB (ref 4.0–10.3)
lymph#: 1.3 10*3/uL (ref 0.9–3.3)

## 2016-07-07 NOTE — Progress Notes (Signed)
Hematology and Oncology Follow Up Visit  Eugene Mccoy [id number] Dec 24, 1975 41 y.o. 07/07/2016 10:08 AM  CC: Eugene Calico, MD   Principle Diagnosis: 41 year old gentleman with neutropenia and lymphocytosis diagnosed in 11/2010. Work up has not shown any disease. He is S/P bone marrow biopsy and CT scans on 11/2010. All are with normal limit.   Current therapy: Observation and surveillance.  Interim History: Eugene Mccoy presents today for a follow-up visit with his family. Since his last visit, he continues to do well without any changes. He continues to work part time without any decline in his ability to do so. He denied any constitutional symptoms of fevers or chills or sweats. He denied any recurrent infections such as cellulitis, sinusitis or urinary tract infections. His appetite and weight have not dramatically changed. He denied any lymphadenopathy or petechiae.   He has not reported any headaches or blurry vision or double vision. Has not reported any chest pain or difficulty breathing. He does not report any cough, wheezing or hemoptysis. Has not reported any nausea or vomiting or abdominal pain. Has not reported any change in his bowel habits or urinary symptoms. Rest of his review of systems unremarkable.   Medications: I have reviewed the patient's current medications.  No current outpatient prescriptions on file.   No current facility-administered medications for this visit.       Allergies: No Known Allergies  Past Medical History, Surgical history, Social history, and Family History were reviewed and updated.    Physical Exam: Blood pressure 123/71, pulse 62, temperature 97.6 F (36.4 C), temperature source Oral, resp. rate 20, weight 218 lb 6.4 oz (99.1 kg), SpO2 100 %. ECOG: 0 General appearance: Pleasant-appearing gentleman without distress. Head: Normocephalic, without obvious abnormality oral mucosa without ulcers or lesions. Neck: no adenopathy Lymph nodes:  Cervical, supraclavicular, and axillary nodes normal. Heart:regular rate and rhythm, S1, S2 normal, no murmur, click, rub or gallop Lung:chest clear, no wheezing, rales, normal symmetric air entry no dullness to percussion. Abdomin: soft, non-tender, without masses or organomegaly no shifting dullness or ascites. EXT:no erythema, induration, or nodules   Lab Results: Lab Results  Component Value Date   WBC 2.4 (L) 07/07/2016   HGB 13.3 07/07/2016   HCT 38.9 07/07/2016   MCV 106.8 (H) 07/07/2016   PLT 144 07/07/2016     Chemistry      Component Value Date/Time   NA 139 10/08/2015 0956   K 4.3 10/08/2015 0956   CL 105 02/08/2014 0919   CL 107 08/26/2012 0905   CO2 25 10/08/2015 0956   BUN 14.7 10/08/2015 0956   CREATININE 1.3 10/08/2015 0956      Component Value Date/Time   CALCIUM 10.1 10/08/2015 0956   ALKPHOS 55 10/08/2015 0956   AST 24 10/08/2015 0956   ALT 26 10/08/2015 0956   BILITOT 0.62 10/08/2015 4739     41 year old gentleman with the following issues:  Impression and Plan: 1.  Lymphocytosis and neutropenia: This has been diagnosed since 2012 and not dramatically changed. His workup has has been unrevealing including a bone marrow biopsy and imaging studies. These findings could be reactive versus evolving bone marrow disorder. There is no signs or symptoms to suggest lymphoproliferative disorder at this time. I recommended continued observation and repeat laboratory testing annually. 2. Macrocytosis without anemia: Unclear etiology at this time could be early myelodysplasia but his bone marrow biopsy did not support that. We will continue to monitor at this time. 3. Follow-up will be  in 12 months.   [facility], MD 3/13/201810:08 AM

## 2016-07-07 NOTE — Telephone Encounter (Signed)
Appointments scheduled per 3/13 LOS. Patient given AVS report and calendars with future scheduled appointments.  °

## 2016-09-29 ENCOUNTER — Other Ambulatory Visit: Payer: Medicare Other

## 2016-09-30 ENCOUNTER — Ambulatory Visit (INDEPENDENT_AMBULATORY_CARE_PROVIDER_SITE_OTHER): Payer: Medicare Other | Admitting: Internal Medicine

## 2016-09-30 ENCOUNTER — Encounter: Payer: Self-pay | Admitting: Internal Medicine

## 2016-09-30 ENCOUNTER — Other Ambulatory Visit (INDEPENDENT_AMBULATORY_CARE_PROVIDER_SITE_OTHER): Payer: Medicare Other

## 2016-09-30 VITALS — BP 118/72 | HR 57 | Temp 98.1°F | Resp 16 | Ht 69.0 in | Wt 214.2 lb

## 2016-09-30 DIAGNOSIS — E785 Hyperlipidemia, unspecified: Secondary | ICD-10-CM | POA: Diagnosis not present

## 2016-09-30 DIAGNOSIS — Z0000 Encounter for general adult medical examination without abnormal findings: Secondary | ICD-10-CM | POA: Diagnosis not present

## 2016-09-30 LAB — LIPID PANEL
CHOLESTEROL: 182 mg/dL (ref 0–200)
HDL: 50.2 mg/dL (ref >39.00)
LDL Cholesterol: 108 mg/dL — ABNORMAL HIGH (ref 0–99)
NonHDL: 131.95
Total CHOL/HDL Ratio: 4
Triglycerides: 122 mg/dL (ref 0.0–149.0)
VLDL: 24.4 mg/dL (ref 0.0–40.0)

## 2016-09-30 LAB — COMPREHENSIVE METABOLIC PANEL
ALBUMIN: 4.8 g/dL (ref 3.5–5.2)
ALK PHOS: 63 U/L (ref 39–117)
ALT: 34 U/L (ref 0–53)
AST: 27 U/L (ref 0–37)
BUN: 13 mg/dL (ref 6–23)
CO2: 27 mEq/L (ref 19–32)
Calcium: 10.1 mg/dL (ref 8.4–10.5)
Chloride: 106 mEq/L (ref 96–112)
Creatinine, Ser: 1.1 mg/dL (ref 0.40–1.50)
GFR: 94.03 mL/min (ref >60.00)
GLUCOSE: 86 mg/dL (ref 70–99)
POTASSIUM: 4 meq/L (ref 3.5–5.1)
Sodium: 139 mEq/L (ref 135–145)
TOTAL PROTEIN: 7.5 g/dL (ref 6.0–8.3)
Total Bilirubin: 0.5 mg/dL (ref 0.2–1.2)

## 2016-09-30 LAB — TSH: TSH: 0.74 u[IU]/mL (ref 0.35–4.50)

## 2016-09-30 NOTE — Patient Instructions (Signed)

## 2016-09-30 NOTE — Progress Notes (Signed)
Subjective:  Patient ID: Eugene Mccoy, male    DOB: 12-19-1975  Age: 41 y.o. MRN: [id number]  CC: Annual Exam and Hyperlipidemia   HPI [name] presents for a CPX.  He is with his father who tells me that the patient is doing well. He continues to work at [employer] and has been functioning and eating normally recently.  Past Medical History:  Diagnosis Date  . Deafness   . 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 leukocytopenia    . TONSILLECTOMY      reports that he has never smoked. He has never used smokeless tobacco. He reports that he does not drink alcohol or use drugs. family history includes Diabetes in his other. No Known Allergies  No outpatient prescriptions prior to visit.   No facility-administered medications prior to visit.     ROS Review of Systems  All other systems reviewed and are negative.   Objective:  BP 118/72 (BP Location: Left Arm, Patient Position: Sitting, Cuff Size: Normal)   Pulse (!) 57   Temp 98.1 F (36.7 C) (Oral)   Resp 16   Ht 5\' 9"  (1.753 m)   Wt 214 lb 4 oz (97.2 kg)   SpO2 99%   BMI 31.64 kg/m   BP Readings from Last 3 Encounters:  09/30/16 118/72  07/07/16 123/71  10/08/15 115/61    Wt Readings from Last 3 Encounters:  09/30/16 214 lb 4 oz (97.2 kg)  07/07/16 218 lb 6.4 oz (99.1 kg)  10/08/15 210 lb (95.3 kg)    Physical Exam  Constitutional: He is oriented to person, place, and time. No distress.  Eyes: Conjunctivae are normal. Right eye exhibits no discharge. Left eye exhibits no discharge. No scleral icterus.  Neck: Normal range of motion. Neck supple. No thyromegaly present.  Cardiovascular: Normal rate, regular rhythm and normal heart sounds.   No murmur heard. Pulmonary/Chest: Effort normal and breath sounds normal. No respiratory distress. He has no wheezes. He has no rales.  Abdominal: Soft. Bowel sounds are normal. He exhibits no  distension and no mass. There is no tenderness. There is no rebound and no guarding. Hernia confirmed negative in the right inguinal area and confirmed negative in the left inguinal area.  Genitourinary: Rectum normal, prostate normal, testes normal and penis normal. Rectal exam shows no external hemorrhoid, no internal hemorrhoid, no fissure, no mass, no tenderness, anal tone normal and guaiac negative stool. Prostate is not enlarged and not tender. Right testis shows no mass, no swelling and no tenderness. Right testis is descended. Left testis shows no mass, no swelling and no tenderness. Left testis is descended. Circumcised. No penile erythema or penile tenderness. No discharge found.  Genitourinary Comments: He has microphallus and very small testicles. There is no detectable prostatic tissue.  Musculoskeletal: Normal range of motion. He exhibits no edema, tenderness or deformity.  Lymphadenopathy:    He has no cervical adenopathy.       Right: No inguinal adenopathy present.       Left: No inguinal adenopathy present.  Neurological: He is oriented to person, place, and time.  Skin: Skin is warm and dry. No rash noted. He is not diaphoretic. No erythema. No pallor.  Psychiatric: He has a normal mood and affect. His behavior is normal.  Vitals reviewed.   Lab Results  Component Value Date   WBC 2.4 (L) 07/07/2016   HGB 13.3 07/07/2016  HCT 38.9 07/07/2016   PLT 144 07/07/2016   GLUCOSE 86 09/30/2016   CHOL 182 09/30/2016   TRIG 122.0 09/30/2016   HDL 50.20 09/30/2016   LDLCALC 108 (H) 09/30/2016   ALT 34 09/30/2016   AST 27 09/30/2016   NA 139 09/30/2016   K 4.0 09/30/2016   CL 106 09/30/2016   CREATININE 1.10 09/30/2016   BUN 13 09/30/2016   CO2 27 09/30/2016   TSH 0.74 09/30/2016   INR 1.03 12/25/2010   HGBA1C 5.0 02/08/2014    US Abdomen Complete  Result Date: 10/14/2012 *RADIOLOGY REPORT* Clinical Data:  Elevated liver enzymes. ABDOMEN ULTRASOUND Technique:  Complete  abdominal ultrasound examination was performed including evaluation of the liver, gallbladder, bile ducts, pancreas, kidneys, spleen, IVC, and abdominal aorta. Comparison:  CT of the abdomen on 12/17/2010. Findings: Gallbladder:  No shadowing gallstones are identified.  Minimal echogenicity within the gallbladder may be consistent with a small amount of biliary sludge.  There is no evidence of gallbladder distention, wall thickening or pericholecystic fluid.  No sonographic Murphy's sign was elicited. Common Bile Duct:  Normal caliber of 3 mm. Liver:  No focal mass lesion seen.  Within normal limits in parenchymal echogenicity. No biliary ductal dilatation identified. IVC:  Very limited visualization of the inferior vena cava. Pancreas:  The pancreas is essentially nonvisualized by ultrasound. Spleen:  The spleen is of normal echotexture and size. Kidneys:  Both kidneys measure approximately 9.6 cm and show no evidence of hydronephrosis or focal lesion. Abdominal Aorta:  The abdominal aorta is poorly visualized. Proximal to mid aorta demonstrate normal caliber. IMPRESSION: Unremarkable abdominal ultrasound.  There may be a small amount of biliary sludge in the gallbladder. Original Report Authenticated By: Aletta Edouard, M.D.    Assessment & Plan:   Antonio was seen today for annual exam and hyperlipidemia.  Diagnoses and all orders for this visit:  Hyperlipidemia with target LDL less than 130- His triglycerides are normal now, his Framingham risk score is only 1% so I do not recommend that he start taking a statin for cardiovascular risk reduction. -     Comprehensive metabolic panel; Future -     Lipid panel; Future -     TSH; Future  Routine general medical examination at a health care facility- exam completed, labs ordered and reviewed, vaccines reviewed, patient education material was given.   Mr. Dworkin does not currently have medications on file.  No orders of the defined types were placed  in this encounter.  See AVS for instructions about healthy living and anticipatory guidance.  Follow-up: Return if symptoms worsen or fail to improve.  [name], MD

## 2016-10-01 NOTE — Assessment & Plan Note (Signed)

## 2017-05-03 ENCOUNTER — Encounter: Payer: Self-pay | Admitting: Family Medicine

## 2017-05-03 ENCOUNTER — Ambulatory Visit (INDEPENDENT_AMBULATORY_CARE_PROVIDER_SITE_OTHER): Payer: Medicare Other | Admitting: Family Medicine

## 2017-05-03 VITALS — BP 110/72 | HR 76 | Temp 97.7°F | Wt 217.0 lb

## 2017-05-03 DIAGNOSIS — B9789 Other viral agents as the cause of diseases classified elsewhere: Secondary | ICD-10-CM | POA: Diagnosis not present

## 2017-05-03 DIAGNOSIS — J069 Acute upper respiratory infection, unspecified: Secondary | ICD-10-CM

## 2017-05-03 MED ORDER — HYDROCODONE-HOMATROPINE 5-1.5 MG/5ML PO SYRP: ORAL_SOLUTION | ORAL | 0 refills | Status: DC

## 2017-05-03 NOTE — Progress Notes (Signed)
Eugene Mccoy is a 42 year old male who is deaf who is brought in by today by his dad wasn't a week history of cough. He's had a cough for a week with no fever chills sore throat nausea vomiting diarrhea or sputum production etc. etc. His dad says he sleeps well at the site and the cough doesn't keep him awake.  Review of systems otherwise negative  BP 110/72 (BP Location: Left Arm, Patient Position: Sitting, Cuff Size: Normal)   Pulse 76   Temp 97.7 F (36.5 C) (Oral)   Wt 217 lb (98.4 kg)   BMI 32.05 kg/m  Well-developed well-nourished deaf male no acute distress HEENT were negative neck was supple thyroid is not enlarged no adenopathy lungs are clear  Cough probably secondary to viral syndrome plan treat symptomatically........Marland Kitchen

## 2017-05-03 NOTE — Patient Instructions (Signed)
Drink lots of liquids  Hydromet.........Marland Kitchen 1/2-1 teaspoon at bedtime when necessary  Call Dr. Ronnald Ramp when necessary

## 2017-07-07 ENCOUNTER — Inpatient Hospital Stay: Payer: Medicare Other | Attending: Oncology | Admitting: Oncology

## 2017-07-07 ENCOUNTER — Telehealth: Payer: Self-pay | Admitting: Oncology

## 2017-07-07 ENCOUNTER — Inpatient Hospital Stay: Payer: Medicare Other

## 2017-07-07 VITALS — BP 113/84 | HR 63 | Temp 97.2°F | Resp 15 | Wt 220.7 lb

## 2017-07-07 DIAGNOSIS — D7589 Other specified diseases of blood and blood-forming organs: Secondary | ICD-10-CM

## 2017-07-07 DIAGNOSIS — D61818 Other pancytopenia: Secondary | ICD-10-CM

## 2017-07-07 DIAGNOSIS — D709 Neutropenia, unspecified: Secondary | ICD-10-CM | POA: Insufficient documentation

## 2017-07-07 LAB — CBC WITH DIFFERENTIAL/PLATELET
Basophils Absolute: 0 10*3/uL (ref 0.0–0.1)
Basophils Relative: 1 %
Eosinophils Absolute: 0 10*3/uL (ref 0.0–0.5)
Eosinophils Relative: 2 %
HEMATOCRIT: 38 % — AB (ref 38.4–49.9)
Hemoglobin: 13 g/dL (ref 13.0–17.1)
LYMPHS ABS: 1.1 10*3/uL (ref 0.9–3.3)
Lymphocytes Relative: 54 %
MCH: 35.9 pg — AB (ref 27.2–33.4)
MCHC: 34.2 g/dL (ref 32.0–36.0)
MCV: 105.2 fL — ABNORMAL HIGH (ref 79.3–98.0)
MONO ABS: 0.2 10*3/uL (ref 0.1–0.9)
MONOS PCT: 10 %
NEUTROS ABS: 0.7 10*3/uL — AB (ref 1.5–6.5)
NEUTROS PCT: 33 %
Platelets: 139 10*3/uL — ABNORMAL LOW (ref 140–400)
RBC: 3.61 MIL/uL — ABNORMAL LOW (ref 4.20–5.82)
RDW: 14.1 % (ref 11.0–14.6)
WBC: 2 10*3/uL — ABNORMAL LOW (ref 4.0–10.3)

## 2017-07-07 NOTE — Telephone Encounter (Signed)
Scheduled appt per 3/13 los - Gave patient AVS and calender per los.  

## 2017-07-07 NOTE — Progress Notes (Signed)
Hematology and Oncology Follow Up Visit  Eugene Mccoy [id number] 10-22-1975 42 y.o. 07/07/2017 10:28 AM  CC: [name], MD   Principle Diagnosis: 42 year old man with benign fluctuating neutropenia diagnosed in 11/2010. His evaluation including a bone marrow biopsy at the time did not reveal a lymphoproliferative disorder.   Current therapy: Observation and surveillance.  Interim History: [name] is here for a follow-up with his family. Since the last visit, he reports no changes in his health. He continues to work without any decline and inability to do so. He denied any recent infections or hospitalizations. He denies any weight loss or appetite changes. Continues to perform activities of daily living without any decline. He denied any lymphadenopathy or petechiae.    He has not reported any headaches or blurry vision or double vision. He does not report any fevers, chills or sweats. Has not reported any chest pain or difficulty breathing. He does not report any cough, wheezing or hemoptysis. Has not reported any nausea or vomiting or abdominal pain. Has not reported any change in his bowel habits or urinary symptoms. He does not report any skeletal complaints of arthralgias or myalgias. He does not report any petechiae or ecchymosis. Rest of his review of systems is negative.   Medications: I have reviewed the patient's current medications.  Current Outpatient Medications  Medication Sig Dispense Refill  . HYDROcodone-homatropine (HYCODAN) 5-1.5 MG/5ML syrup 1/2-1 teaspoon at bedtime when necessary 120 mL 0   No current facility-administered medications for this visit.       Allergies: No Known Allergies  Past Medical History, Surgical history, Social history, and Family History were reviewed and updated.    Physical Exam: Blood pressure 113/84, pulse 63, temperature (!) 97.2 F (36.2 C), temperature source Oral, resp. rate 15, weight 220 lb 11.2 oz (100.1 kg), SpO2 100  %. ECOG: 0 General appearance: Alert, awake gentleman without distress. Head: Normocephalic, without obvious abnormality  Oropharynx: oral mucosa without ulcers or lesions. Eyes: No scleral icterus. Lymph nodes: Cervical, supraclavicular, and axillary nodes normal. Heart:regular rate and rhythm, S1, S2 normal, no murmur, click, rub or gallop Lung: Clear to auscultation without any rhonchi wheezes or dullness to percussion. Abdomin: Soft, nontender without any rebound or guarding. Musculoskeletal: No joint deformity or effusion.   Lab Results: Lab Results  Component Value Date   WBC 2.0 (L) 07/07/2017   HGB 13.0 07/07/2017   HCT 38.0 (L) 07/07/2017   MCV 105.2 (H) 07/07/2017   PLT 139 (L) 07/07/2017     Chemistry      Component Value Date/Time   NA 139 09/30/2016 1017   NA 139 10/08/2015 0956   K 4.0 09/30/2016 1017   K 4.3 10/08/2015 0956   CL 106 09/30/2016 1017   CL 107 08/26/2012 0905   CO2 27 09/30/2016 1017   CO2 25 10/08/2015 0956   BUN 13 09/30/2016 1017   BUN 14.7 10/08/2015 0956   CREATININE 1.10 09/30/2016 1017   CREATININE 1.3 10/08/2015 0956      Component Value Date/Time   CALCIUM 10.1 09/30/2016 1017   CALCIUM 10.1 10/08/2015 0956   ALKPHOS 63 09/30/2016 1017   ALKPHOS 55 10/08/2015 0956   AST 27 09/30/2016 1017   AST 24 10/08/2015 0956   ALT 34 09/30/2016 1017   ALT 26 10/08/2015 0956   BILITOT 0.5 09/30/2016 1017   BILITOT 0.62 10/08/2015 5260     42 year old man with:  Impression and plan:   1. Benign fluctuating neutropenia  diagnosed and 2012: Evaluation of the time was a bone marrow biopsy did not show any evidence of lymphoproliferative disorder. He has been on active surveillance since that time.  His CBC reviewed today and showed any major changes in his laboratory testing. His absolute neutrophil count remained stable around 700. He has normal hemoglobin at this time.  2. Macrocytosis: The differential diagnosis was reviewed today  which include myelodysplasia, plasma cell disorder among others. He has no evidence of anemia or continue to monitor this finding.  3. Age-appropriate cancer screening: He is up-to-date at this time.  4. Follow-up: Will be in 12 months sooner if needed to.  15  minutes was spent with the patient face-to-face today.  More than 50% of time was dedicated to patient counseling, education and coordination of his multifaceted care.    Zola Button, MD 3/13/201910:28 AM

## 2018-03-18 DIAGNOSIS — H906 Mixed conductive and sensorineural hearing loss, bilateral: Secondary | ICD-10-CM | POA: Diagnosis not present

## 2018-07-06 ENCOUNTER — Inpatient Hospital Stay: Payer: Medicare Other

## 2018-07-06 ENCOUNTER — Other Ambulatory Visit: Payer: Self-pay

## 2018-07-06 ENCOUNTER — Telehealth: Payer: Self-pay | Admitting: Oncology

## 2018-07-06 ENCOUNTER — Inpatient Hospital Stay: Payer: Medicare Other | Attending: Oncology | Admitting: Oncology

## 2018-07-06 VITALS — BP 118/73 | HR 60 | Temp 97.7°F | Resp 17 | Ht 69.0 in | Wt 215.2 lb

## 2018-07-06 DIAGNOSIS — D7589 Other specified diseases of blood and blood-forming organs: Secondary | ICD-10-CM | POA: Insufficient documentation

## 2018-07-06 DIAGNOSIS — D61818 Other pancytopenia: Secondary | ICD-10-CM

## 2018-07-06 DIAGNOSIS — D709 Neutropenia, unspecified: Secondary | ICD-10-CM | POA: Diagnosis not present

## 2018-07-06 LAB — CBC WITH DIFFERENTIAL (CANCER CENTER ONLY)
ABS IMMATURE GRANULOCYTES: 0 10*3/uL (ref 0.00–0.07)
BASOS ABS: 0 10*3/uL (ref 0.0–0.1)
Basophils Relative: 1 %
Eosinophils Absolute: 0.1 10*3/uL (ref 0.0–0.5)
Eosinophils Relative: 2 %
HCT: 37.4 % — ABNORMAL LOW (ref 39.0–52.0)
Hemoglobin: 12.9 g/dL — ABNORMAL LOW (ref 13.0–17.0)
Immature Granulocytes: 0 %
LYMPHS PCT: 50 %
Lymphs Abs: 1.1 10*3/uL (ref 0.7–4.0)
MCH: 36 pg — ABNORMAL HIGH (ref 26.0–34.0)
MCHC: 34.5 g/dL (ref 30.0–36.0)
MCV: 104.5 fL — ABNORMAL HIGH (ref 80.0–100.0)
MONO ABS: 0.3 10*3/uL (ref 0.1–1.0)
Monocytes Relative: 13 %
NEUTROS ABS: 0.7 10*3/uL — AB (ref 1.7–7.7)
NRBC: 0 % (ref 0.0–0.2)
Neutrophils Relative %: 34 %
PLATELETS: 134 10*3/uL — AB (ref 150–400)
RBC: 3.58 MIL/uL — AB (ref 4.22–5.81)
RDW: 14 % (ref 11.5–15.5)
WBC: 2.1 10*3/uL — AB (ref 4.0–10.5)

## 2018-07-06 LAB — CMP (CANCER CENTER ONLY)
ALT: 22 U/L (ref 0–44)
AST: 22 U/L (ref 15–41)
Albumin: 4.3 g/dL (ref 3.5–5.0)
Alkaline Phosphatase: 70 U/L (ref 38–126)
Anion gap: 13 (ref 5–15)
BILIRUBIN TOTAL: 0.4 mg/dL (ref 0.3–1.2)
BUN: 16 mg/dL (ref 6–20)
CHLORIDE: 108 mmol/L (ref 98–111)
CO2: 23 mmol/L (ref 22–32)
Calcium: 9.6 mg/dL (ref 8.9–10.3)
Creatinine: 1.24 mg/dL (ref 0.61–1.24)
Glucose, Bld: 92 mg/dL (ref 70–99)
POTASSIUM: 4.5 mmol/L (ref 3.5–5.1)
Sodium: 144 mmol/L (ref 135–145)
TOTAL PROTEIN: 7.4 g/dL (ref 6.5–8.1)

## 2018-07-06 NOTE — Telephone Encounter (Signed)
Gave avs and calendar ° °

## 2018-07-06 NOTE — Progress Notes (Signed)
Hematology and Oncology Follow Up Visit  Eugene Mccoy [id number] 1975-10-07 43 y.o. 07/06/2018 9:53 AM  CC: Eugene Calico, MD   Principle Diagnosis: 43 year old man with neutropenia diagnosed in 11/2010.  The differential diagnosis include benign fluctuating neutropenia without any evidence to suggest hematological disorder.   Current therapy: Active surveillance.  Interim History: Eugene Mccoy returns today for repeat evaluation.  Since the last visit, he reports no major changes in his health.  He is accompanied by his parents and they provided the majority of his history for him.  He continues to work in [employer] without any recent hospitalization or illnesses.  He denies any recent skin infection, pneumonia or bronchitis.  His performance status quality of life remains maintained.  Patient denied any alteration mental status, neuropathy, confusion or dizziness.  Denies any headaches or lethargy.  Denies any night sweats, weight loss or changes in appetite.  Denied orthopnea, dyspnea on exertion or chest discomfort.  Denies shortness of breath, difficulty breathing hemoptysis or cough.  Denies any abdominal distention, nausea, early satiety or dyspepsia.  Denies any hematuria, frequency, dysuria or nocturia.  Denies any skin irritation, dryness or rash.  Denies any ecchymosis or petechiae.  Denies any lymphadenopathy or clotting.  Denies any heat or cold intolerance.  Denies any anxiety or depression.  Remaining review of system is negative.      Medications: I have reviewed the patient's current medications.  Current Outpatient Medications  Medication Sig Dispense Refill  . HYDROcodone-homatropine (HYCODAN) 5-1.5 MG/5ML syrup 1/2-1 teaspoon at bedtime when necessary 120 mL 0   No current facility-administered medications for this visit.       Allergies: No Known Allergies  Past Medical History, Surgical history, Social history, and Family History were reviewed and  updated.    Physical Exam: Blood pressure 118/73, pulse 60, temperature 97.7 F (36.5 C), temperature source Oral, resp. rate 17, height 5\' 9"  (1.753 m), weight 215 lb 3.2 oz (97.6 kg), SpO2 100 %.   ECOG: 0   General appearance: Comfortable appearing without any discomfort Head: Normocephalic without any trauma Oropharynx: Mucous membranes are moist and pink without any thrush or ulcers. Eyes: Pupils are equal and round reactive to light. Lymph nodes: No cervical, supraclavicular, inguinal or axillary lymphadenopathy.   Heart:regular rate and rhythm.  S1 and S2 without leg edema. Lung: Clear without any rhonchi or wheezes.  No dullness to percussion. Abdomin: Soft, nontender, nondistended with good bowel sounds.  No hepatosplenomegaly. Musculoskeletal: No joint deformity or effusion.  Full range of motion noted. Neurological: No deficits noted on motor, sensory and deep tendon reflex exam. Skin: No petechial rash or dryness.  Appeared moist.     Lab Results: Lab Results  Component Value Date   WBC 2.1 (L) 07/06/2018   HGB 12.9 (L) 07/06/2018   HCT 37.4 (L) 07/06/2018   MCV 104.5 (H) 07/06/2018   PLT 134 (L) 07/06/2018     Chemistry      Component Value Date/Time   NA 139 09/30/2016 1017   NA 139 10/08/2015 0956   K 4.0 09/30/2016 1017   K 4.3 10/08/2015 0956   CL 106 09/30/2016 1017   CL 107 08/26/2012 0905   CO2 27 09/30/2016 1017   CO2 25 10/08/2015 0956   BUN 13 09/30/2016 1017   BUN 14.7 10/08/2015 0956   CREATININE 1.10 09/30/2016 1017   CREATININE 1.3 10/08/2015 0956      Component Value Date/Time   CALCIUM 10.1 09/30/2016 1017  CALCIUM 10.1 10/08/2015 0956   ALKPHOS 63 09/30/2016 1017   ALKPHOS 55 10/08/2015 0956   AST 27 09/30/2016 1017   AST 24 10/08/2015 0956   ALT 34 09/30/2016 1017   ALT 26 10/08/2015 0956   BILITOT 0.5 09/30/2016 1017   BILITOT 0.62 10/08/2015 5680     43 year old man with:  Impression and plan:   1.  Neutropenia  diagnosed in 2012 without any evidence of hematological disorder.  He has been on active surveillance with no evidence of bone marrow disease since that time.   CBC obtained today showed no major changes in his count with absolute neutrophil count that is stable.  He denies any recurrent infections at this time or hospitalizations.  Treatment options were reviewed today which include continued active surveillance versus growth factor support.  At this time I see no need for any growth factor support and will continue to monitor.  2. Macrocytosis: His work-up has been unrevealing for plasma cell disorder.  We will continue to monitor periodically.  3. Follow-up: Will be in 12 months for repeat testing.  15  minutes was spent with the patient face-to-face today.  More than 50% of time was dedicated to reviewing his disease status, laboratory data and discussing the differential diagnosis.    Zola Button, MD 3/11/20209:53 AM

## 2019-02-22 ENCOUNTER — Encounter: Payer: Self-pay | Admitting: Internal Medicine

## 2019-02-22 ENCOUNTER — Ambulatory Visit (INDEPENDENT_AMBULATORY_CARE_PROVIDER_SITE_OTHER): Payer: Medicare Other | Admitting: Internal Medicine

## 2019-02-22 ENCOUNTER — Other Ambulatory Visit (INDEPENDENT_AMBULATORY_CARE_PROVIDER_SITE_OTHER): Payer: Medicare Other

## 2019-02-22 ENCOUNTER — Other Ambulatory Visit: Payer: Self-pay

## 2019-02-22 VITALS — BP 122/78 | HR 63 | Temp 97.6°F | Resp 16 | Ht 69.0 in | Wt 216.0 lb

## 2019-02-22 DIAGNOSIS — Z0000 Encounter for general adult medical examination without abnormal findings: Secondary | ICD-10-CM | POA: Diagnosis not present

## 2019-02-22 DIAGNOSIS — E785 Hyperlipidemia, unspecified: Secondary | ICD-10-CM

## 2019-02-22 DIAGNOSIS — N400 Enlarged prostate without lower urinary tract symptoms: Secondary | ICD-10-CM

## 2019-02-22 DIAGNOSIS — D61818 Other pancytopenia: Secondary | ICD-10-CM

## 2019-02-22 DIAGNOSIS — Z23 Encounter for immunization: Secondary | ICD-10-CM

## 2019-02-22 LAB — CBC WITH DIFFERENTIAL/PLATELET
Basophils Absolute: 0.1 10*3/uL (ref 0.0–0.1)
Basophils Relative: 3.3 % — ABNORMAL HIGH (ref 0.0–3.0)
Eosinophils Absolute: 0.1 10*3/uL (ref 0.0–0.7)
Eosinophils Relative: 2.7 % (ref 0.0–5.0)
HCT: 39.2 % (ref 39.0–52.0)
Hemoglobin: 13.5 g/dL (ref 13.0–17.0)
Lymphocytes Relative: 47.8 % — ABNORMAL HIGH (ref 12.0–46.0)
Lymphs Abs: 1.1 10*3/uL (ref 0.7–4.0)
MCHC: 34.4 g/dL (ref 30.0–36.0)
MCV: 104.4 fl — ABNORMAL HIGH (ref 78.0–100.0)
Monocytes Absolute: 0.2 10*3/uL (ref 0.1–1.0)
Monocytes Relative: 11 % (ref 3.0–12.0)
Neutro Abs: 0.8 10*3/uL — ABNORMAL LOW (ref 1.4–7.7)
Neutrophils Relative %: 35.2 % — ABNORMAL LOW (ref 43.0–77.0)
Platelets: 155 10*3/uL (ref 150.0–400.0)
RBC: 3.76 Mil/uL — ABNORMAL LOW (ref 4.22–5.81)
RDW: 14.2 % (ref 11.5–15.5)
WBC: 2.3 10*3/uL — ABNORMAL LOW (ref 4.0–10.5)

## 2019-02-22 LAB — HEPATIC FUNCTION PANEL
ALT: 23 U/L (ref 0–53)
AST: 23 U/L (ref 0–37)
Albumin: 4.7 g/dL (ref 3.5–5.2)
Alkaline Phosphatase: 67 U/L (ref 39–117)
Bilirubin, Direct: 0.1 mg/dL (ref 0.0–0.3)
Total Bilirubin: 0.6 mg/dL (ref 0.2–1.2)
Total Protein: 7.2 g/dL (ref 6.0–8.3)

## 2019-02-22 LAB — BASIC METABOLIC PANEL
BUN: 14 mg/dL (ref 6–23)
CO2: 28 mEq/L (ref 19–32)
Calcium: 10.1 mg/dL (ref 8.4–10.5)
Chloride: 103 mEq/L (ref 96–112)
Creatinine, Ser: 1.11 mg/dL (ref 0.40–1.50)
GFR: 86.59 mL/min (ref >60.00)
Glucose, Bld: 97 mg/dL (ref 70–99)
Potassium: 4.7 mEq/L (ref 3.5–5.1)
Sodium: 139 mEq/L (ref 135–145)

## 2019-02-22 LAB — LIPID PANEL
Cholesterol: 209 mg/dL — ABNORMAL HIGH (ref 0–200)
HDL: 56 mg/dL (ref >39.00)
LDL Cholesterol: 133 mg/dL — ABNORMAL HIGH (ref 0–99)
NonHDL: 153.46
Total CHOL/HDL Ratio: 4
Triglycerides: 100 mg/dL (ref 0.0–149.0)
VLDL: 20 mg/dL (ref 0.0–40.0)

## 2019-02-22 NOTE — Patient Instructions (Signed)

## 2019-02-22 NOTE — Progress Notes (Signed)
Subjective:  Patient ID: Eugene Mccoy, male    DOB: 1976/04/17  Age: 43 y.o. MRN: [id number]  CC: Annual Exam   HPI Eugene Mccoy presents for a CPX.  His father tells me that he is doing well.  His appetite's been good, his activity level has been normal, and he is maintaining a stable weight.  The patient cannot offer much history but his father seems to think that he is doing pretty well.  Outpatient Medications Prior to Visit  Medication Sig Dispense Refill  . HYDROcodone-homatropine (HYCODAN) 5-1.5 MG/5ML syrup 1/2-1 teaspoon at bedtime when necessary (Patient not taking: Reported on 07/06/2018) 120 mL 0   No facility-administered medications prior to visit.     ROS Review of Systems  Constitutional: Negative for diaphoresis and unexpected weight change.  Respiratory: Negative for cough and shortness of breath.   Cardiovascular: Negative for chest pain.  Gastrointestinal: Negative for abdominal pain, blood in stool, diarrhea and nausea.  Genitourinary: Negative for difficulty urinating.  Skin: Negative.   Neurological: Negative.  Negative for headaches.  Hematological: Negative for adenopathy. Does not bruise/bleed easily.  All other systems reviewed and are negative.   Objective:  BP 122/78 (BP Location: Left Arm, Patient Position: Sitting, Cuff Size: Normal)   Pulse 63   Temp 97.6 F (36.4 C) (Oral)   Resp 16   Ht 5\' 9"  (1.753 m)   Wt 216 lb (98 kg)   SpO2 98%   BMI 31.90 kg/m   BP Readings from Last 3 Encounters:  02/22/19 122/78  07/06/18 118/73  07/07/17 113/84    Wt Readings from Last 3 Encounters:  02/22/19 216 lb (98 kg)  07/06/18 215 lb 3.2 oz (97.6 kg)  07/07/17 220 lb 11.2 oz (100.1 kg)    Physical Exam Vitals signs reviewed. Exam conducted with a chaperone present (his father).  HENT:     Nose: Nose normal.     Mouth/Throat:     Mouth: Mucous membranes are moist.  Eyes:     General: No scleral icterus.    Conjunctiva/sclera:  Conjunctivae normal.  Neck:     Musculoskeletal: Neck supple.  Cardiovascular:     Rate and Rhythm: Normal rate and regular rhythm.     Heart sounds: No murmur.  Pulmonary:     Effort: Pulmonary effort is normal.     Breath sounds: No stridor. No wheezing, rhonchi or rales.  Abdominal:     General: Abdomen is protuberant. Bowel sounds are normal. There is no distension.     Palpations: There is no hepatomegaly or splenomegaly.     Tenderness: There is no abdominal tenderness.     Hernia: No hernia is present. There is no hernia in the left inguinal area or right inguinal area.  Genitourinary:    Pubic Area: No rash.      Penis: Normal. No lesions.      Scrotum/Testes: Normal.        Right: Mass or tenderness not present.        Left: Mass or tenderness not present.     Epididymis:     Right: Normal.     Left: Normal.     Prostate: Enlarged (1+ smooth symm). Not tender and no nodules present.     Rectum: Normal. Guaiac result negative. No mass, tenderness, anal fissure, external hemorrhoid or internal hemorrhoid. Normal anal tone.  Musculoskeletal: Normal range of motion.     Right lower leg: No edema.  Left lower leg: No edema.  Lymphadenopathy:     Cervical: No cervical adenopathy.     Lower Body: No right inguinal adenopathy. No left inguinal adenopathy.  Skin:    General: Skin is warm and dry.     Coloration: Skin is not pale.  Neurological:     General: No focal deficit present.     Mental Status: He is alert.  Psychiatric:        Mood and Affect: Mood normal.        Behavior: Behavior normal.     Lab Results  Component Value Date   WBC 2.3 Repeated and verified X2. (L) 02/22/2019   HGB 13.5 02/22/2019   HCT 39.2 02/22/2019   PLT 155.0 02/22/2019   GLUCOSE 97 02/22/2019   CHOL 209 (H) 02/22/2019   TRIG 100.0 02/22/2019   HDL 56.00 02/22/2019   LDLCALC 133 (H) 02/22/2019   ALT 23 02/22/2019   AST 23 02/22/2019   NA 139 02/22/2019   K 4.7 02/22/2019   CL  103 02/22/2019   CREATININE 1.11 02/22/2019   BUN 14 02/22/2019   CO2 28 02/22/2019   TSH 0.89 02/22/2019   PSA 0.00 (L) 02/22/2019   INR 1.03 12/25/2010   HGBA1C 5.0 02/08/2014    US Abdomen Complete  Result Date: 10/14/2012 *RADIOLOGY REPORT* Clinical Data:  Elevated liver enzymes. ABDOMEN ULTRASOUND Technique:  Complete abdominal ultrasound examination was performed including evaluation of the liver, gallbladder, bile ducts, pancreas, kidneys, spleen, IVC, and abdominal aorta. Comparison:  CT of the abdomen on 12/17/2010. Findings: Gallbladder:  No shadowing gallstones are identified.  Minimal echogenicity within the gallbladder may be consistent with a small amount of biliary sludge.  There is no evidence of gallbladder distention, wall thickening or pericholecystic fluid.  No sonographic Murphy's sign was elicited. Common Bile Duct:  Normal caliber of 3 mm. Liver:  No focal mass lesion seen.  Within normal limits in parenchymal echogenicity. No biliary ductal dilatation identified. IVC:  Very limited visualization of the inferior vena cava. Pancreas:  The pancreas is essentially nonvisualized by ultrasound. Spleen:  The spleen is of normal echotexture and size. Kidneys:  Both kidneys measure approximately 9.6 cm and show no evidence of hydronephrosis or focal lesion. Abdominal Aorta:  The abdominal aorta is poorly visualized. Proximal to mid aorta demonstrate normal caliber. IMPRESSION: Unremarkable abdominal ultrasound.  There may be a small amount of biliary sludge in the gallbladder. Original Report Authenticated By: Aletta Edouard, M.D.    Assessment & Plan:   Forbes was seen today for annual exam.  Diagnoses and all orders for this visit:  Routine general medical examination at a health care facility- Exam completed, labs reviewed, vaccines reviewed and updated, patient education material was given.  Pancytopenia ([place])- His red cell lines and his platelet count are normal now.  The  only lingering cytopenia is mild lymphopenia.  This is reassuring. -     CBC with Differential/Platelet; Future -     Basic metabolic panel; Future  Hyperlipidemia with target LDL less than 130- He does not have an elevated ASCVD risk score so I did not recommend a statin for CV risk reduction. -     Lipid panel; Future -     TSH; Future -     Hepatic function panel; Future -     Basic metabolic panel; Future  Need for influenza vaccination -     Flu Vaccine QUAD 36+ mos IM  Benign prostatic hyperplasia without lower  urinary tract symptoms- His PSA is undetectable which is reassuring that he does not have prostate cancer.  He has no symptoms that need to be treated. -     PSA; Future   I have discontinued [employer]'s HYDROcodone-homatropine.  No orders of the defined types were placed in this encounter.    Follow-up: Return in about 1 year (around 02/22/2020).  [name], MD

## 2019-02-23 LAB — PSA: PSA: 0 ng/mL — ABNORMAL LOW (ref 0.10–4.00)

## 2019-02-23 LAB — TSH: TSH: 0.89 u[IU]/mL (ref 0.35–4.50)

## 2019-02-24 ENCOUNTER — Encounter: Payer: Self-pay | Admitting: Internal Medicine

## 2019-07-01 ENCOUNTER — Ambulatory Visit: Payer: Medicare Other | Attending: Internal Medicine

## 2019-07-01 DIAGNOSIS — Z23 Encounter for immunization: Secondary | ICD-10-CM | POA: Insufficient documentation

## 2019-07-01 NOTE — Progress Notes (Signed)
   Covid-19 Vaccination Clinic  Name:  Eugene Mccoy    MRN: [id number] DOB: 09-20-1975  07/01/2019  Mr. [name] was observed post Covid-19 immunization for 15 minutes without incident. He was provided with Vaccine Information Sheet and instruction to access the V-Safe system.   Mr. [name] was instructed to call 911 with any severe reactions post vaccine: Marland Kitchen Difficulty breathing  . Swelling of face and throat  . A fast heartbeat  . A bad rash all over body  . Dizziness and weakness   Immunizations Administered    Name Date Dose VIS Date Route   Pfizer COVID-19 Vaccine 07/01/2019  1:01 PM 0.3 mL 04/07/2019 Intramuscular   Manufacturer: [place]   Lot: [id number]   [place]: [id number]

## 2019-07-12 ENCOUNTER — Inpatient Hospital Stay: Payer: Medicare Other

## 2019-07-12 ENCOUNTER — Inpatient Hospital Stay: Payer: Medicare Other | Attending: Oncology | Admitting: Oncology

## 2019-07-22 ENCOUNTER — Ambulatory Visit: Payer: Medicare Other | Attending: Internal Medicine

## 2019-07-22 DIAGNOSIS — Z23 Encounter for immunization: Secondary | ICD-10-CM

## 2019-07-22 NOTE — Progress Notes (Signed)
   Covid-19 Vaccination Clinic  Name:  Eugene Mccoy    MRN: [id number] DOB: Jan 22, 1976  07/22/2019  [name] was observed post Covid-19 immunization for 15 minutes without incident. He was provided with Vaccine Information Sheet and instruction to access the V-Safe system.   [name] was instructed to call 911 with any severe reactions post vaccine: Marland Kitchen Difficulty breathing  . Swelling of face and throat  . A fast heartbeat  . A bad rash all over body  . Dizziness and weakness   Immunizations Administered    Name Date Dose VIS Date Route   Pfizer COVID-19 Vaccine 07/22/2019  2:15 PM 0.3 mL 04/07/2019 Intramuscular   Manufacturer: [place]   Lot: [id number]   [place]: [id number]

## 2019-12-18 ENCOUNTER — Telehealth: Payer: Self-pay

## 2019-12-19 NOTE — Telephone Encounter (Signed)
Form completed, signed by PCP and place up front for pick up. Copy sent to scan.   Pt mother informed forms were ready for pick up.

## 2020-02-26 ENCOUNTER — Other Ambulatory Visit: Payer: Self-pay

## 2020-02-26 ENCOUNTER — Encounter: Payer: Self-pay | Admitting: Internal Medicine

## 2020-02-26 ENCOUNTER — Ambulatory Visit (INDEPENDENT_AMBULATORY_CARE_PROVIDER_SITE_OTHER): Payer: Medicare Other | Admitting: Internal Medicine

## 2020-02-26 VITALS — BP 110/68 | HR 62 | Temp 97.7°F | Ht 69.0 in | Wt 202.0 lb

## 2020-02-26 DIAGNOSIS — Z23 Encounter for immunization: Secondary | ICD-10-CM | POA: Diagnosis not present

## 2020-02-26 DIAGNOSIS — D61818 Other pancytopenia: Secondary | ICD-10-CM | POA: Diagnosis not present

## 2020-02-26 DIAGNOSIS — E785 Hyperlipidemia, unspecified: Secondary | ICD-10-CM | POA: Diagnosis not present

## 2020-02-26 DIAGNOSIS — N400 Enlarged prostate without lower urinary tract symptoms: Secondary | ICD-10-CM | POA: Diagnosis not present

## 2020-02-26 DIAGNOSIS — Z1211 Encounter for screening for malignant neoplasm of colon: Secondary | ICD-10-CM

## 2020-02-26 DIAGNOSIS — Z0000 Encounter for general adult medical examination without abnormal findings: Secondary | ICD-10-CM | POA: Diagnosis not present

## 2020-02-26 LAB — CBC WITH DIFFERENTIAL/PLATELET
Basophils Absolute: 0 10*3/uL (ref 0.0–0.1)
Basophils Relative: 1.6 % (ref 0.0–3.0)
Eosinophils Absolute: 0 10*3/uL (ref 0.0–0.7)
Eosinophils Relative: 2.1 % (ref 0.0–5.0)
HCT: 38.1 % — ABNORMAL LOW (ref 39.0–52.0)
Hemoglobin: 13.4 g/dL (ref 13.0–17.0)
Lymphocytes Relative: 49.5 % — ABNORMAL HIGH (ref 12.0–46.0)
Lymphs Abs: 1.2 10*3/uL (ref 0.7–4.0)
MCHC: 35.1 g/dL (ref 30.0–36.0)
MCV: 103.9 fl — ABNORMAL HIGH (ref 78.0–100.0)
Monocytes Absolute: 0.2 10*3/uL (ref 0.1–1.0)
Monocytes Relative: 8.4 % (ref 3.0–12.0)
Neutro Abs: 0.9 10*3/uL — ABNORMAL LOW (ref 1.4–7.7)
Neutrophils Relative %: 38.4 % — ABNORMAL LOW (ref 43.0–77.0)
Platelets: 156 10*3/uL (ref 150.0–400.0)
RBC: 3.67 Mil/uL — ABNORMAL LOW (ref 4.22–5.81)
RDW: 13.8 % (ref 11.5–15.5)
WBC: 2.4 10*3/uL — ABNORMAL LOW (ref 4.0–10.5)

## 2020-02-26 LAB — HEPATIC FUNCTION PANEL
ALT: 13 U/L (ref 0–53)
AST: 19 U/L (ref 0–37)
Albumin: 4.6 g/dL (ref 3.5–5.2)
Alkaline Phosphatase: 49 U/L (ref 39–117)
Bilirubin, Direct: 0.1 mg/dL (ref 0.0–0.3)
Total Bilirubin: 0.4 mg/dL (ref 0.2–1.2)
Total Protein: 7.2 g/dL (ref 6.0–8.3)

## 2020-02-26 LAB — LIPID PANEL
Cholesterol: 180 mg/dL (ref 0–200)
HDL: 54.9 mg/dL (ref >39.00)
LDL Cholesterol: 95 mg/dL (ref 0–99)
NonHDL: 125.16
Total CHOL/HDL Ratio: 3
Triglycerides: 151 mg/dL — ABNORMAL HIGH (ref 0.0–149.0)
VLDL: 30.2 mg/dL (ref 0.0–40.0)

## 2020-02-26 LAB — BASIC METABOLIC PANEL
BUN: 15 mg/dL (ref 6–23)
CO2: 27 mEq/L (ref 19–32)
Calcium: 9.8 mg/dL (ref 8.4–10.5)
Chloride: 104 mEq/L (ref 96–112)
Creatinine, Ser: 1.03 mg/dL (ref 0.40–1.50)
GFR: 87.28 mL/min (ref >60.00)
Glucose, Bld: 83 mg/dL (ref 70–99)
Potassium: 4.4 mEq/L (ref 3.5–5.1)
Sodium: 138 mEq/L (ref 135–145)

## 2020-02-26 LAB — PSA: PSA: 0 ng/mL — ABNORMAL LOW (ref 0.10–4.00)

## 2020-02-26 NOTE — Progress Notes (Signed)
Subjective:  Patient ID: Eugene Mccoy, male    DOB: June 28, 1975  Age: 44 y.o. MRN: [id number]  CC: Annual Exam  This visit occurred during the SARS-CoV-2 public health emergency.  Safety protocols were in place, including screening questions prior to the visit, additional usage of staff PPE, and extensive cleaning of exam room while observing appropriate contact time as indicated for disinfecting solutions.    HPI GARALD RHEW presents for a CPX.  He is with his father and he tells me that Khalifa is doing well.  No outpatient medications prior to visit.   No facility-administered medications prior to visit.    ROS Review of Systems  Constitutional: Negative for appetite change, diaphoresis, fatigue and unexpected weight change.  HENT: Negative.  Negative for sore throat.   Eyes: Negative.   Respiratory: Negative for cough and shortness of breath.   Cardiovascular: Negative for chest pain, palpitations and leg swelling.  Gastrointestinal: Negative for abdominal pain, diarrhea, nausea and vomiting.  Endocrine: Negative.   Genitourinary: Negative.  Negative for difficulty urinating and scrotal swelling.  Musculoskeletal: Negative for arthralgias and myalgias.  Skin: Negative.  Negative for color change and pallor.  Neurological: Negative.  Negative for dizziness, weakness and headaches.  Hematological: Negative for adenopathy. Does not bruise/bleed easily.  Psychiatric/Behavioral: Negative.     Objective:  BP 110/68   Pulse 62   Temp 97.7 F (36.5 C) (Oral)   Ht 5\' 9"  (1.753 m)   Wt 202 lb (91.6 kg)   SpO2 99%   BMI 29.83 kg/m   BP Readings from Last 3 Encounters:  02/26/20 110/68  02/22/19 122/78  07/06/18 118/73    Wt Readings from Last 3 Encounters:  02/26/20 202 lb (91.6 kg)  02/22/19 216 lb (98 kg)  07/06/18 215 lb 3.2 oz (97.6 kg)    Physical Exam Vitals reviewed.  HENT:     Nose: Nose normal.     Mouth/Throat:     Mouth: Mucous membranes are  moist.  Eyes:     General: No scleral icterus.    Conjunctiva/sclera: Conjunctivae normal.  Cardiovascular:     Rate and Rhythm: Normal rate and regular rhythm.     Heart sounds: No murmur heard.   Pulmonary:     Effort: Pulmonary effort is normal.     Breath sounds: No stridor. No wheezing, rhonchi or rales.  Abdominal:     General: Abdomen is protuberant. Bowel sounds are normal. There is no distension.     Palpations: Abdomen is soft. There is no hepatomegaly, splenomegaly or mass.     Tenderness: There is no abdominal tenderness.  Musculoskeletal:        General: Normal range of motion.     Cervical back: Neck supple.     Right lower leg: No edema.     Left lower leg: No edema.  Lymphadenopathy:     Cervical: No cervical adenopathy.  Skin:    General: Skin is warm and dry.  Neurological:     General: No focal deficit present.     Mental Status: He is alert.  Psychiatric:        Mood and Affect: Mood normal.     Lab Results  Component Value Date   WBC 2.4 Repeated and verified X2. (L) 02/26/2020   HGB 13.4 02/26/2020   HCT 38.1 (L) 02/26/2020   PLT 156.0 02/26/2020   GLUCOSE 83 02/26/2020   CHOL 180 02/26/2020   TRIG 151.0 (H) 02/26/2020  HDL 54.90 02/26/2020   LDLCALC 95 02/26/2020   ALT 13 02/26/2020   AST 19 02/26/2020   NA 138 02/26/2020   K 4.4 02/26/2020   CL 104 02/26/2020   CREATININE 1.03 02/26/2020   BUN 15 02/26/2020   CO2 27 02/26/2020   TSH 0.89 02/22/2019   PSA 0.00 (L) 02/26/2020   INR 1.03 12/25/2010   HGBA1C 5.0 02/08/2014    US Abdomen Complete  Result Date: 10/14/2012 *RADIOLOGY REPORT* Clinical Data:  Elevated liver enzymes. ABDOMEN ULTRASOUND Technique:  Complete abdominal ultrasound examination was performed including evaluation of the liver, gallbladder, bile ducts, pancreas, kidneys, spleen, IVC, and abdominal aorta. Comparison:  CT of the abdomen on 12/17/2010. Findings: Gallbladder:  No shadowing gallstones are identified.   Minimal echogenicity within the gallbladder may be consistent with a small amount of biliary sludge.  There is no evidence of gallbladder distention, wall thickening or pericholecystic fluid.  No sonographic Murphy's sign was elicited. Common Bile Duct:  Normal caliber of 3 mm. Liver:  No focal mass lesion seen.  Within normal limits in parenchymal echogenicity. No biliary ductal dilatation identified. IVC:  Very limited visualization of the inferior vena cava. Pancreas:  The pancreas is essentially nonvisualized by ultrasound. Spleen:  The spleen is of normal echotexture and size. Kidneys:  Both kidneys measure approximately 9.6 cm and show no evidence of hydronephrosis or focal lesion. Abdominal Aorta:  The abdominal aorta is poorly visualized. Proximal to mid aorta demonstrate normal caliber. IMPRESSION: Unremarkable abdominal ultrasound.  There may be a small amount of biliary sludge in the gallbladder. Original Report Authenticated By: Aletta Edouard, M.D.    Assessment & Plan:   Grant was seen today for annual exam.  Diagnoses and all orders for this visit:  Pancytopenia ([place])- He is mildly anemic and white cell count is stable. Platelets are normal. This is stable. Will continue to monitor. -     CBC with Differential/Platelet; Future -     Basic metabolic panel; Future -     Basic metabolic panel -     CBC with Differential/Platelet  Benign prostatic hyperplasia without lower urinary tract symptoms- His PSA is very low. He has no symptoms that need to be treated. -     PSA; Future -     PSA  Hyperlipidemia with target LDL less than 130- He does not have an elevated ASCVD risk score. Statin therapy is not indicated. -     Lipid panel; Future -     Hepatic function panel; Future -     Hepatic function panel -     Lipid panel  Routine general medical examination at a health care facility- Exam completed, labs reviewed, vaccines reviewed, cancer screenings addressed, pt ed material was  given.  Other orders -     Flu Vaccine QUAD 6+ mos PF IM (Fluarix Quad PF)   Eugene Mccoy does not currently have medications on file.  No orders of the defined types were placed in this encounter.    Follow-up: Return in about 6 months (around 08/25/2020).  [name], MD

## 2020-02-26 NOTE — Patient Instructions (Signed)

## 2020-02-27 ENCOUNTER — Encounter: Payer: Self-pay | Admitting: Internal Medicine

## 2020-02-27 DIAGNOSIS — Z1211 Encounter for screening for malignant neoplasm of colon: Secondary | ICD-10-CM | POA: Insufficient documentation

## 2020-03-01 ENCOUNTER — Ambulatory Visit: Payer: Medicare Other | Attending: Internal Medicine

## 2020-03-01 DIAGNOSIS — Z23 Encounter for immunization: Secondary | ICD-10-CM

## 2020-03-01 NOTE — Progress Notes (Signed)
   Covid-19 Vaccination Clinic  Name:  Eugene Mccoy    MRN: [id number] DOB: Jun 19, 1975  03/01/2020  [name] was observed post Covid-19 immunization for 15 minutes without incident. He was provided with Vaccine Information Sheet and instruction to access the V-Safe system.   [name] was instructed to call 911 with any severe reactions post vaccine: Marland Kitchen Difficulty breathing  . Swelling of face and throat  . A fast heartbeat  . A bad rash all over body  . Dizziness and weakness

## 2020-05-07 ENCOUNTER — Encounter: Payer: Self-pay | Admitting: Internal Medicine

## 2020-05-07 ENCOUNTER — Telehealth: Payer: Self-pay | Admitting: Internal Medicine

## 2020-05-07 NOTE — Telephone Encounter (Signed)
Patient's father Jeneen Rinks drop off an request form for  excusal from [employer] on 06/11/2020.   Please call Jeneen Rinks back at [phone] when the forms are completed.

## 2020-05-07 NOTE — Telephone Encounter (Signed)
Letter has been completed and placed in providers box to review and sign.

## 2020-05-09 NOTE — Telephone Encounter (Signed)
Letter has been signed. James informed letter is ready to be picked up.

## 2020-06-10 ENCOUNTER — Ambulatory Visit: Payer: Medicare Other

## 2020-06-10 ENCOUNTER — Telehealth: Payer: Self-pay

## 2020-06-10 NOTE — Telephone Encounter (Signed)
Left voice message for patient to return call for [facility].

## 2020-06-20 ENCOUNTER — Ambulatory Visit: Payer: Medicare Other

## 2020-08-08 ENCOUNTER — Ambulatory Visit (INDEPENDENT_AMBULATORY_CARE_PROVIDER_SITE_OTHER): Payer: Medicare Other

## 2020-08-08 ENCOUNTER — Other Ambulatory Visit: Payer: Self-pay

## 2020-08-08 VITALS — BP 120/78 | HR 55 | Temp 98.3°F | Ht 69.0 in | Wt 201.4 lb

## 2020-08-08 DIAGNOSIS — Z0000 Encounter for general adult medical examination without abnormal findings: Secondary | ICD-10-CM

## 2020-08-08 NOTE — Progress Notes (Signed)
Subjective:   Eugene Mccoy is a 45 y.o. male who presents for Medicare Annual/Subsequent preventive examination.  Review of Systems    No ROS. Medicare Wellness Visit. Additional risk factors are reflected in social history. Cardiac Risk Factors include: dyslipidemia;male gender     Objective:    Today's Vitals   08/08/20 1048  BP: 120/78  Pulse: (!) 55  Temp: 98.3 F (36.8 C)  SpO2: 97%  Weight: 201 lb 6.4 oz (91.4 kg)  Height: 5\' 9"  (1.753 m)  PainSc: 0-No pain   Body mass index is 29.74 kg/m.  Advanced Directives 08/08/2020 07/06/2018 10/01/2016 07/07/2016 10/08/2015 07/20/2015  Does Patient Have a Medical Advance Directive? No Yes Yes;No No No No  Does patient want to make changes to medical advance directive? - - No - Patient declined - - -  Would patient like information on creating a medical advance directive? No - Patient declined - No - Patient declined - No - patient declined information No - patient declined information    Current Medications (verified) No outpatient encounter medications on file as of 08/08/2020.   No facility-administered encounter medications on file as of 08/08/2020.    Allergies (verified) Patient has no known allergies.   History: Past Medical History:  Diagnosis Date  . Deafness   . 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 leukocytopenia    . TONSILLECTOMY     Family History  Problem Relation Age of Onset  . Diabetes Other        1st degree relative   Social History   Socioeconomic History  . Marital status: Single    Spouse name: Not on file  . Number of children: Not on file  . Years of education: Not on file  . Highest education level: Not on file  Occupational History  . Occupation: Advertising account planner: [employer]  Tobacco Use  . Smoking status: Never Smoker  . Smokeless tobacco: Never Used  Substance and Sexual Activity  . Alcohol use: No   . Drug use: No  . Sexual activity: Not Currently  Other Topics Concern  . Not on file  Social History Narrative   Regular exercise-no            Social Determinants of Health   Financial Resource Strain: Low Risk   . Difficulty of Paying Living Expenses: Not hard at all  Food Insecurity: No Food Insecurity  . Worried About Charity fundraiser in the Last Year: Never true  . Ran Out of Food in the Last Year: Never true  Transportation Needs: No Transportation Needs  . Lack of Transportation (Medical): No  . Lack of Transportation (Non-Medical): No  Physical Activity: Inactive  . Days of Exercise per Week: 0 days  . Minutes of Exercise per Session: 0 min  Stress: No Stress Concern Present  . Feeling of Stress : Not at all  Social Connections: Not on file    Tobacco Counseling Counseling given: Not Answered   Clinical Intake:  Pre-visit preparation completed: Yes  Pain : No/denies pain Pain Score: 0-No pain     BMI - recorded: 29.74 Nutritional Status: BMI 25 -29 Overweight Nutritional Risks: None Diabetes: No  How often do you need to have someone help you when you read instructions, pamphlets, or other written materials from your doctor or pharmacy?: 1 - Never What is the last grade level you completed  in school?: GED  Diabetic? no  Interpreter Needed?: No  Information entered by :: Lisette Abu, LPN   Activities of Daily Living In your present state of health, do you have any difficulty performing the following activities: 08/08/2020  Hearing? Y  Comment Patient is deaf  Vision? N  Difficulty concentrating or making decisions? Y  Walking or climbing stairs? N  Dressing or bathing? N  Doing errands, shopping? Y  Preparing Food and eating ? N  Using the Toilet? N  In the past six months, have you accidently leaked urine? N  Do you have problems with loss of bowel control? N  Managing your Medications? Y  Managing your Finances? Y  Housekeeping  or managing your Housekeeping? Y  Some recent data might be hidden    Patient Care Team: Janith Lima, MD as PCP - General  Indicate any recent Medical Services you may have received from other than [facility] providers in the past year (date may be approximate).     Assessment:   This is a routine wellness examination for Eugene Mccoy.  Hearing/Vision screen No exam data present  Dietary issues and exercise activities discussed: Current Exercise Habits: The patient does not participate in regular exercise at present, Exercise limited by: Other - see comments (mild intellectal diability)  Goals   None    Depression Screen PHQ 2/9 Scores 08/08/2020 02/22/2019 10/01/2016 09/30/2016 07/20/2015 02/12/2014  PHQ - 2 Score 0 - 0 0 0 0  PHQ- 9 Score - - - 0 - -  Exception Documentation - Medical reason - - - -    Fall Risk Fall Risk  08/08/2020 02/22/2019 10/01/2016 07/20/2015 03/06/2014  Falls in the past year? 0 0 No No No  Number falls in past yr: 0 0 - - -  Injury with Fall? 0 0 - - -  Risk for fall due to : No Fall Risks - - - -  Follow up Falls evaluation completed Falls evaluation completed - - -    FALL RISK PREVENTION PERTAINING TO THE HOME:  Any stairs in or around the home? Yes  If so, are there any without handrails? No  Home free of loose throw rugs in walkways, pet beds, electrical cords, etc? Yes  Adequate lighting in your home to reduce risk of falls? Yes   ASSISTIVE DEVICES UTILIZED TO PREVENT FALLS:  Life alert? No  Use of a cane, walker or w/c? No  Grab bars in the bathroom? No  Shower chair or bench in shower? No  Elevated toilet seat or a handicapped toilet? No   TIMED UP AND GO:  Was the test performed? No .  Length of time to ambulate 10 feet: 0 sec.   Gait steady and fast without use of assistive device  Cognitive Function:  Cognitive status assessed by direct observation. Patient has current diagnosis of cognitive impairment.(mild intellectual  disability) Patient is unable to complete screening 6CIT or MMSE.          Immunizations Immunization History  Administered Date(s) Administered  . Influenza,inj,Quad PF,6+ Mos 02/08/2014, 02/22/2019, 02/26/2020  . PFIZER(Purple Top)SARS-COV-2 Vaccination 07/01/2019, 07/22/2019, 03/01/2020  . Td 05/12/2010    TDAP status: Due, Education has been provided regarding the importance of this vaccine. Advised may receive this vaccine at local pharmacy or Health Dept. Aware to provide a copy of the vaccination record if obtained from local pharmacy or Health Dept. Verbalized acceptance and understanding.  Flu Vaccine status: Up to date  Pneumococcal vaccine  status: Due, Education has been provided regarding the importance of this vaccine. Advised may receive this vaccine at local pharmacy or Health Dept. Aware to provide a copy of the vaccination record if obtained from local pharmacy or Health Dept. Verbalized acceptance and understanding.  Covid-19 vaccine status: Completed vaccines  Qualifies for Shingles Vaccine? No   Zostavax completed No   Shingrix Completed?: No.    Education has been provided regarding the importance of this vaccine. Patient has been advised to call insurance company to determine out of pocket expense if they have not yet received this vaccine. Advised may also receive vaccine at local pharmacy or Health Dept. Verbalized acceptance and understanding.  Screening Tests Health Maintenance  Topic Date Due  . COLONOSCOPY (Pts 45-108yrs Insurance coverage will need to be confirmed)  Never done  . TETANUS/TDAP  05/12/2020  . INFLUENZA VACCINE  11/25/2020  . COVID-19 Vaccine  Completed  . Hepatitis C Screening  Completed  . HIV Screening  Completed  . HPV VACCINES  Aged Out    Health Maintenance  Health Maintenance Due  Topic Date Due  . COLONOSCOPY (Pts 45-48yrs Insurance coverage will need to be confirmed)  Never done  . TETANUS/TDAP  05/12/2020    Colorectal  screening: never done  Lung Cancer Screening: (Low Dose CT Chest recommended if Age 70-80 years, 30 pack-year currently smoking OR have quit w/in 15years.) does not qualify.   Lung Cancer Screening Referral: no  Additional Screening:  Hepatitis C Screening: does qualify; Completed yes  Vision Screening: Recommended annual ophthalmology exams for early detection of glaucoma and other disorders of the eye. Is the patient up to date with their annual eye exam?  No  Who is the provider or what is the name of the office in which the patient attends annual eye exams? N/A If pt is not established with a provider, would they like to be referred to a provider to establish care? No .   Dental Screening: Recommended annual dental exams for proper oral hygiene  Community Resource Referral / Chronic Care Management: CRR required this visit?  No   CCM required this visit?  No      Plan:     I have personally reviewed and noted the following in the patient's chart:   . Medical and social history . Use of alcohol, tobacco or illicit drugs  . Current medications and supplements . Functional ability and status . Nutritional status . Physical activity . Advanced directives . List of other physicians . Hospitalizations, surgeries, and ER visits in previous 12 months . Vitals . Screenings to include cognitive, depression, and falls . Referrals and appointments  In addition, I have reviewed and discussed with patient certain preventive protocols, quality metrics, and best practice recommendations. A written personalized care plan for preventive services as well as general preventive health recommendations were provided to patient.     Sheral Flow, LPN   [id number]   Nurse Notes:  Medications reviewed with patient; no opioid use noted.

## 2020-08-08 NOTE — Patient Instructions (Signed)
Eugene Mccoy , Thank you for taking time to come for your Medicare Wellness Visit. I appreciate your ongoing commitment to your health goals. Please review the following plan we discussed and let me know if I can assist you in the future.   Screening recommendations/referrals: Colonoscopy: never done Recommended yearly ophthalmology/optometry visit for glaucoma screening and checkup Recommended yearly dental visit for hygiene and checkup  Vaccinations: Influenza vaccine: 02/26/2020 Pneumococcal vaccine: never done Tdap vaccine: 05/12/2010; due every 10 years Shingles vaccine: not of age   Covid-19: 07/01/2019, 07/22/2019, 03/01/2020  Advanced directives: Advance directive discussed with you today. Even though you declined this today please call our office should you change your mind and we can give you the proper paperwork for you to fill out.  Conditions/risks identified: Yes  Next appointment: Please schedule your next Medicare Wellness Visit with your Nurse Health Advisor in 1 year by calling [phone].  Preventive Care 40-64 Years, Male Preventive care refers to lifestyle choices and visits with your health care provider that can promote health and wellness. What does preventive care include?  A yearly physical exam. This is also called an annual well check.  Dental exams once or twice a year.  Routine eye exams. Ask your health care provider how often you should have your eyes checked.  Personal lifestyle choices, including:  Daily care of your teeth and gums.  Regular physical activity.  Eating a healthy diet.  Avoiding tobacco and drug use.  Limiting alcohol use.  Practicing safe sex.  Taking low-dose aspirin every day starting at age 35. What happens during an annual well check? The services and screenings done by your health care provider during your annual well check will depend on your age, overall health, lifestyle risk factors, and family history of  disease. Counseling  Your health care provider may ask you questions about your:  Alcohol use.  Tobacco use.  Drug use.  Emotional well-being.  Home and relationship well-being.  Sexual activity.  Eating habits.  Work and work Statistician. Screening  You may have the following tests or measurements:  Height, weight, and BMI.  Blood pressure.  Lipid and cholesterol levels. These may be checked every 5 years, or more frequently if you are over 36 years old.  Skin check.  Lung cancer screening. You may have this screening every year starting at age 52 if you have a 30-pack-year history of smoking and currently smoke or have quit within the past 15 years.  Fecal occult blood test (FOBT) of the stool. You may have this test every year starting at age 64.  Flexible sigmoidoscopy or colonoscopy. You may have a sigmoidoscopy every 5 years or a colonoscopy every 10 years starting at age 68.  Prostate cancer screening. Recommendations will vary depending on your family history and other risks.  Hepatitis C blood test.  Hepatitis B blood test.  Sexually transmitted disease (STD) testing.  Diabetes screening. This is done by checking your blood sugar (glucose) after you have not eaten for a while (fasting). You may have this done every 1-3 years. Discuss your test results, treatment options, and if necessary, the need for more tests with your health care provider. Vaccines  Your health care provider may recommend certain vaccines, such as:  Influenza vaccine. This is recommended every year.  Tetanus, diphtheria, and acellular pertussis (Tdap, Td) vaccine. You may need a Td booster every 10 years.  Zoster vaccine. You may need this after age 3.  Pneumococcal 13-valent conjugate (PCV13)  vaccine. You may need this if you have certain conditions and have not been vaccinated.  Pneumococcal polysaccharide (PPSV23) vaccine. You may need one or two doses if you smoke cigarettes  or if you have certain conditions. Talk to your health care provider about which screenings and vaccines you need and how often you need them. This information is not intended to replace advice given to you by your health care provider. Make sure you discuss any questions you have with your health care provider. Document Released: 05/10/2015 Document Revised: 01/01/2016 Document Reviewed: 02/12/2015 Elsevier Interactive Patient Education  2017 [place] Prevention in the Home Falls can cause injuries. They can happen to people of all ages. There are many things you can do to make your home safe and to help prevent falls. What can I do on the outside of my home?  Regularly fix the edges of walkways and driveways and fix any cracks.  Remove anything that might make you trip as you walk through a door, such as a raised step or threshold.  Trim any bushes or trees on the path to your home.  Use bright outdoor lighting.  Clear any walking paths of anything that might make someone trip, such as rocks or tools.  Regularly check to see if handrails are loose or broken. Make sure that both sides of any steps have handrails.  Any raised decks and porches should have guardrails on the edges.  Have any leaves, snow, or ice cleared regularly.  Use sand or salt on walking paths during winter.  Clean up any spills in your garage right away. This includes oil or grease spills. What can I do in the bathroom?  Use night lights.  Install grab bars by the toilet and in the tub and shower. Do not use towel bars as grab bars.  Use non-skid mats or decals in the tub or shower.  If you need to sit down in the shower, use a plastic, non-slip stool.  Keep the floor dry. Clean up any water that spills on the floor as soon as it happens.  Remove soap buildup in the tub or shower regularly.  Attach bath mats securely with double-sided non-slip rug tape.  Do not have throw rugs and other  things on the floor that can make you trip. What can I do in the bedroom?  Use night lights.  Make sure that you have a light by your bed that is easy to reach.  Do not use any sheets or blankets that are too big for your bed. They should not hang down onto the floor.  Have a firm chair that has side arms. You can use this for support while you get dressed.  Do not have throw rugs and other things on the floor that can make you trip. What can I do in the kitchen?  Clean up any spills right away.  Avoid walking on wet floors.  Keep items that you use a lot in easy-to-reach places.  If you need to reach something above you, use a strong step stool that has a grab bar.  Keep electrical cords out of the way.  Do not use floor polish or wax that makes floors slippery. If you must use wax, use non-skid floor wax.  Do not have throw rugs and other things on the floor that can make you trip. What can I do with my stairs?  Do not leave any items on the stairs.  Make sure  that there are handrails on both sides of the stairs and use them. Fix handrails that are broken or loose. Make sure that handrails are as long as the stairways.  Check any carpeting to make sure that it is firmly attached to the stairs. Fix any carpet that is loose or worn.  Avoid having throw rugs at the top or bottom of the stairs. If you do have throw rugs, attach them to the floor with carpet tape.  Make sure that you have a light switch at the top of the stairs and the bottom of the stairs. If you do not have them, ask someone to add them for you. What else can I do to help prevent falls?  Wear shoes that:  Do not have high heels.  Have rubber bottoms.  Are comfortable and fit you well.  Are closed at the toe. Do not wear sandals.  If you use a stepladder:  Make sure that it is fully opened. Do not climb a closed stepladder.  Make sure that both sides of the stepladder are locked into place.  Ask  someone to hold it for you, if possible.  Clearly mark and make sure that you can see:  Any grab bars or handrails.  First and last steps.  Where the edge of each step is.  Use tools that help you move around (mobility aids) if they are needed. These include:  Canes.  Walkers.  Scooters.  Crutches.  Turn on the lights when you go into a dark area. Replace any light bulbs as soon as they burn out.  Set up your furniture so you have a clear path. Avoid moving your furniture around.  If any of your floors are uneven, fix them.  If there are any pets around you, be aware of where they are.  Review your medicines with your doctor. Some medicines can make you feel dizzy. This can increase your chance of falling. Ask your doctor what other things that you can do to help prevent falls. This information is not intended to replace advice given to you by your health care provider. Make sure you discuss any questions you have with your health care provider. Document Released: 02/07/2009 Document Revised: 09/19/2015 Document Reviewed: 05/18/2014 Elsevier Interactive Patient Education  2017 [employer].

## 2020-09-17 ENCOUNTER — Other Ambulatory Visit: Payer: Self-pay

## 2020-09-18 ENCOUNTER — Ambulatory Visit (INDEPENDENT_AMBULATORY_CARE_PROVIDER_SITE_OTHER): Payer: Medicare Other | Admitting: Internal Medicine

## 2020-09-18 ENCOUNTER — Encounter: Payer: Self-pay | Admitting: Internal Medicine

## 2020-09-18 DIAGNOSIS — R1033 Periumbilical pain: Secondary | ICD-10-CM

## 2020-09-18 DIAGNOSIS — R109 Unspecified abdominal pain: Secondary | ICD-10-CM | POA: Insufficient documentation

## 2020-09-18 NOTE — Progress Notes (Signed)
   Subjective:   Patient ID: Eugene Mccoy, male    DOB: 1975/08/30, 45 y.o.   MRN: [id number]  HPI The patient is a 45 YO man coming in with father who provides history for concerns about abdominal pain. Location around the belly button. Complained about this Monday was not severe and went to work as normal. Has not complained of it since. No change in diet. Dad does not know if constipated no known diarrhea. No fevers or chills. Has tried nothing.    Review of Systems  Constitutional: Negative.   HENT: Negative.   Eyes: Negative.   Respiratory: Negative for cough, chest tightness and shortness of breath.   Cardiovascular: Negative for chest pain, palpitations and leg swelling.  Gastrointestinal: Positive for abdominal pain. Negative for abdominal distention, constipation, diarrhea, nausea and vomiting.  Musculoskeletal: Negative.   Skin: Negative.   Neurological: Negative.   Psychiatric/Behavioral: Negative.     Objective:  Physical Exam Constitutional:      Appearance: He is well-developed.  HENT:     Head: Normocephalic and atraumatic.  Cardiovascular:     Rate and Rhythm: Normal rate and regular rhythm.  Pulmonary:     Effort: Pulmonary effort is normal. No respiratory distress.     Breath sounds: Normal breath sounds. No wheezing or rales.  Abdominal:     General: Bowel sounds are normal. There is no distension.     Palpations: Abdomen is soft.     Tenderness: There is no abdominal tenderness. There is no rebound.  Musculoskeletal:     Cervical back: Normal range of motion.  Skin:    General: Skin is warm and dry.  Neurological:     Mental Status: He is alert. Mental status is at baseline.     Coordination: Coordination normal.     Vitals:   09/18/20 0935  BP: 116/70  Pulse: 60  Resp: 18  Temp: 97.9 F (36.6 C)  TempSrc: Oral  SpO2: 98%  Weight: 180 lb (81.6 kg)  Height: 5\' 9"  (1.753 m)    This visit occurred during the SARS-CoV-2 public health  emergency.  Safety protocols were in place, including screening questions prior to the visit, additional usage of staff PPE, and extensive cleaning of exam room while observing appropriate contact time as indicated for disinfecting solutions.   Assessment & Plan:  Visit time 15 minutes in face to face communication with patient and coordination of care, additional 5 minutes spent in record review, coordination or care, ordering tests, communicating/referring to other healthcare professionals, documenting in medical records all on the same day of the visit for total time 20 minutes spent on the visit.

## 2020-09-18 NOTE — Patient Instructions (Signed)
We will have you watch to see if he is constipated. If so add fiber to his diet or metamucil.   If he gets the pain again try tums or maalox to see if this could be coming from heartburn.

## 2020-09-18 NOTE — Assessment & Plan Note (Signed)
No pain on exam. Asked to monitor for pain and for constipation. If recurrent try tums or maalox. Let us know how symptoms are doing. No labs or imaging indicated today.

## 2021-01-02 ENCOUNTER — Other Ambulatory Visit: Payer: Self-pay

## 2021-01-03 ENCOUNTER — Ambulatory Visit (INDEPENDENT_AMBULATORY_CARE_PROVIDER_SITE_OTHER): Payer: Medicare Other | Admitting: Internal Medicine

## 2021-01-03 ENCOUNTER — Encounter: Payer: Self-pay | Admitting: Internal Medicine

## 2021-01-03 DIAGNOSIS — N400 Enlarged prostate without lower urinary tract symptoms: Secondary | ICD-10-CM

## 2021-01-03 MED ORDER — TAMSULOSIN HCL 0.4 MG PO CAPS: 0.4000 mg | ORAL_CAPSULE | Freq: Every day | ORAL | 3 refills | Status: DC

## 2021-01-03 NOTE — Patient Instructions (Signed)
We will have you set a schedule for going to the bathroom. Try every 2 hours and if this goes well you can go to every 3-4 hours.   If this does not help with the bathroom we have sent in a medicine called flomax which is 1 pill at night time that helps with people having more time to get to the bathroom when they need to go.

## 2021-01-03 NOTE — Assessment & Plan Note (Signed)
Unclear if this is cause. Dad does not have a lot of information about this problem and he cannot say. Advised to try a toileting schedule every 2 hours then advance to longer if able to stay without accident. Rx flomax to try if unable to do this or if this does not work. They declined urine sample today.

## 2021-01-03 NOTE — Progress Notes (Signed)
   Subjective:   Patient ID: Eugene Mccoy, male    DOB: 03-07-1976, 45 y.o.   MRN: [id number]  HPI The patient is a 45 YO man coming in with dad about some urinary accidents. He is not sure what is happening. This is sporadic. He denies pain or burning with urination. Denies fevers or chills. No abdominal pain.   Review of Systems  Constitutional: Negative.   HENT: Negative.    Eyes: Negative.   Respiratory:  Negative for cough, chest tightness and shortness of breath.   Cardiovascular:  Negative for chest pain, palpitations and leg swelling.  Gastrointestinal:  Negative for abdominal distention, abdominal pain, constipation, diarrhea, nausea and vomiting.  Genitourinary:        Urinary incontinence.   Musculoskeletal: Negative.   Skin: Negative.   Neurological: Negative.   Psychiatric/Behavioral: Negative.     Objective:  Physical Exam Constitutional:      Appearance: He is well-developed.  HENT:     Head: Normocephalic and atraumatic.  Cardiovascular:     Rate and Rhythm: Normal rate and regular rhythm.  Pulmonary:     Effort: Pulmonary effort is normal. No respiratory distress.     Breath sounds: Normal breath sounds. No wheezing or rales.  Abdominal:     General: Bowel sounds are normal. There is no distension.     Palpations: Abdomen is soft.     Tenderness: There is no abdominal tenderness. There is no rebound.  Musculoskeletal:     Cervical back: Normal range of motion.  Skin:    General: Skin is warm and dry.  Neurological:     Mental Status: He is alert.     Coordination: Coordination normal.     Comments: Responds     Vitals:   01/03/21 1531  BP: 120/70  Pulse: 66  Resp: 18  Temp: 98.2 F (36.8 C)  TempSrc: Oral  SpO2: 97%  Weight: 200 lb 12.8 oz (91.1 kg)  Height: '5\' 9"'$  (1.753 m)    This visit occurred during the SARS-CoV-2 public health emergency.  Safety protocols were in place, including screening questions prior to the visit, additional usage  of staff PPE, and extensive cleaning of exam room while observing appropriate contact time as indicated for disinfecting solutions.   Assessment & Plan:

## 2021-02-17 ENCOUNTER — Telehealth (INDEPENDENT_AMBULATORY_CARE_PROVIDER_SITE_OTHER): Payer: Medicare Other | Admitting: Internal Medicine

## 2021-02-17 ENCOUNTER — Other Ambulatory Visit: Payer: Self-pay

## 2021-02-17 DIAGNOSIS — R051 Acute cough: Secondary | ICD-10-CM | POA: Diagnosis not present

## 2021-02-17 MED ORDER — DOXYCYCLINE HYCLATE 100 MG PO TABS: 100.0000 mg | ORAL_TABLET | Freq: Two times a day (BID) | ORAL | 0 refills | Status: DC

## 2021-02-17 MED ORDER — HYDROCODONE BIT-HOMATROP MBR 5-1.5 MG/5ML PO SOLN: 5.0000 mL | Freq: Four times a day (QID) | ORAL | 0 refills | Status: DC | PRN

## 2021-02-17 NOTE — Progress Notes (Signed)
Patient ID: Eugene Mccoy, male   DOB: 08-19-1975, 45 y.o.   MRN: [id number]  Virtual Visit via Video Note  I connected with Eugene Mccoy on Feb 17, 2021 at  2:20 PM EDT by a video enabled telemedicine application and verified that I am speaking with the correct person using two identifiers.  Location of all participants today Patient: at home Provider: at office   I discussed the limitations of evaluation and management by telemedicine and the availability of in person appointments. The patient expressed understanding and agreed to proceed.  History of Present Illness: Here with acute onset mild to mod 4-5 days ST, HA, general weakness and malaise, with prod cough greenish sputum, but Pt denies chest pain, increased sob or doe, wheezing, orthopnea, PND, increased LE swelling, palpitations, dizziness or syncope.  Pt denies recent wt loss, night sweats, loss of appetite, or other constitutional symptoms.  Has not checked for covid this episode yet but has kit at home.  Pt denies polydipsia, polyuria, or new focal neuro s/s. 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 He has never used smokeless tobacco. He reports that he does not drink alcohol and does not use drugs. family history includes Diabetes in an other family member. No Known Allergies Current Outpatient Medications on File Prior to Visit  Medication Sig Dispense Refill   tamsulosin (FLOMAX) 0.4 MG CAPS capsule Take 1 capsule (0.4 mg total) by mouth daily. 30 capsule 3   No current facility-administered medications on file prior to visit.    Observations/Objective: Alert, NAD, appropriate mood and affect, resps normal, cn 2-12 intact, moves all 4s, no visible rash or swelling Lab Results  Component Value Date   WBC 2.4 Repeated and verified X2. (L)  02/26/2020   HGB 13.4 02/26/2020   HCT 38.1 (L) 02/26/2020   PLT 156.0 02/26/2020   GLUCOSE 83 02/26/2020   CHOL 180 02/26/2020   TRIG 151.0 (H) 02/26/2020   HDL 54.90 02/26/2020   LDLCALC 95 02/26/2020   ALT 13 02/26/2020   AST 19 02/26/2020   NA 138 02/26/2020   K 4.4 02/26/2020   CL 104 02/26/2020   CREATININE 1.03 02/26/2020   BUN 15 02/26/2020   CO2 27 02/26/2020   TSH 0.89 02/22/2019   PSA 0.00 (L) 02/26/2020   INR 1.03 12/25/2010   HGBA1C 5.0 02/08/2014   Assessment and Plan: See notes  Follow Up Instructions: See notes  I discussed the assessment and treatment plan with the patient. The patient was provided an opportunity to ask questions and all were answered. The patient agreed with the plan and demonstrated an understanding of the instructions.   The patient was advised to call back or seek an in-person evaluation if the symptoms worsen or if the condition fails to improve as anticipated.   Cathlean Cower, MD

## 2021-02-24 ENCOUNTER — Encounter: Payer: Self-pay | Admitting: Internal Medicine

## 2021-02-24 DIAGNOSIS — R059 Cough, unspecified: Secondary | ICD-10-CM | POA: Insufficient documentation

## 2021-02-24 NOTE — Patient Instructions (Signed)
Please take all new medication as prescribed 

## 2021-02-24 NOTE — Assessment & Plan Note (Signed)
Etiology unclear but likely infectious, cant r/o bronchitis vs pna , declines cxr or ED, for pt to check covid at home, empiric doxycycline and cough med prn,  to f/u any worsening symptoms or concerns

## 2021-02-27 ENCOUNTER — Ambulatory Visit (INDEPENDENT_AMBULATORY_CARE_PROVIDER_SITE_OTHER): Payer: Medicare Other | Admitting: Internal Medicine

## 2021-02-27 ENCOUNTER — Other Ambulatory Visit: Payer: Self-pay

## 2021-02-27 ENCOUNTER — Encounter: Payer: Self-pay | Admitting: Internal Medicine

## 2021-02-27 VITALS — BP 118/74 | HR 77 | Temp 98.6°F | Resp 16 | Ht 69.0 in | Wt 204.0 lb

## 2021-02-27 DIAGNOSIS — E785 Hyperlipidemia, unspecified: Secondary | ICD-10-CM

## 2021-02-27 DIAGNOSIS — Z23 Encounter for immunization: Secondary | ICD-10-CM | POA: Diagnosis not present

## 2021-02-27 DIAGNOSIS — Z0000 Encounter for general adult medical examination without abnormal findings: Secondary | ICD-10-CM

## 2021-02-27 DIAGNOSIS — Z1211 Encounter for screening for malignant neoplasm of colon: Secondary | ICD-10-CM

## 2021-02-27 DIAGNOSIS — N400 Enlarged prostate without lower urinary tract symptoms: Secondary | ICD-10-CM

## 2021-02-27 DIAGNOSIS — D61818 Other pancytopenia: Secondary | ICD-10-CM | POA: Diagnosis not present

## 2021-02-27 LAB — PSA: PSA: 0 ng/mL — ABNORMAL LOW (ref 0.10–4.00)

## 2021-02-27 LAB — LIPID PANEL
Cholesterol: 171 mg/dL (ref 0–200)
HDL: 61.4 mg/dL (ref >39.00)
LDL Cholesterol: 93 mg/dL (ref 0–99)
NonHDL: 109.27
Total CHOL/HDL Ratio: 3
Triglycerides: 83 mg/dL (ref 0.0–149.0)
VLDL: 16.6 mg/dL (ref 0.0–40.0)

## 2021-02-27 LAB — CBC WITH DIFFERENTIAL/PLATELET
Basophils Absolute: 0 10*3/uL (ref 0.0–0.1)
Basophils Relative: 0.5 % (ref 0.0–3.0)
Eosinophils Absolute: 0 10*3/uL (ref 0.0–0.7)
Eosinophils Relative: 2.1 % (ref 0.0–5.0)
HCT: 34.7 % — ABNORMAL LOW (ref 39.0–52.0)
Hemoglobin: 12 g/dL — ABNORMAL LOW (ref 13.0–17.0)
Lymphocytes Relative: 47.9 % — ABNORMAL HIGH (ref 12.0–46.0)
Lymphs Abs: 1 10*3/uL (ref 0.7–4.0)
MCHC: 34.5 g/dL (ref 30.0–36.0)
MCV: 103.5 fl — ABNORMAL HIGH (ref 78.0–100.0)
Monocytes Absolute: 0.2 10*3/uL (ref 0.1–1.0)
Monocytes Relative: 10.8 % (ref 3.0–12.0)
Neutro Abs: 0.8 10*3/uL — ABNORMAL LOW (ref 1.4–7.7)
Neutrophils Relative %: 38.7 % — ABNORMAL LOW (ref 43.0–77.0)
Platelets: 152 10*3/uL (ref 150.0–400.0)
RBC: 3.35 Mil/uL — ABNORMAL LOW (ref 4.22–5.81)
RDW: 14.3 % (ref 11.5–15.5)
WBC: 2.1 10*3/uL — ABNORMAL LOW (ref 4.0–10.5)

## 2021-02-27 MED ORDER — BOOSTRIX 5-2.5-18.5 LF-MCG/0.5 IM SUSP: 0.5000 mL | Freq: Once | INTRAMUSCULAR | 0 refills | Status: AC

## 2021-02-27 NOTE — Patient Instructions (Signed)
Health Maintenance, Male Adopting a healthy lifestyle and getting preventive care are important in promoting health and wellness. Ask your health care provider about: The right schedule for you to have regular tests and exams. Things you can do on your own to prevent diseases and keep yourself healthy. What should I know about diet, weight, and exercise? Eat a healthy diet  Eat a diet that includes plenty of vegetables, fruits, low-fat dairy products, and lean protein. Do not eat a lot of foods that are high in solid fats, added sugars, or sodium. Maintain a healthy weight Body mass index (BMI) is a measurement that can be used to identify possible weight problems. It estimates body fat based on height and weight. Your health care provider can help determine your BMI and help you achieve or maintain a healthy weight. Get regular exercise Get regular exercise. This is one of the most important things you can do for your health. Most adults should: Exercise for at least 150 minutes each week. The exercise should increase your heart rate and make you sweat (moderate-intensity exercise). Do strengthening exercises at least twice a week. This is in addition to the moderate-intensity exercise. Spend less time sitting. Even light physical activity can be beneficial. Watch cholesterol and blood lipids Have your blood tested for lipids and cholesterol at 47 years of age, then have this test every 5 years. You may need to have your cholesterol levels checked more often if: Your lipid or cholesterol levels are high. You are older than 47 years of age. You are at high risk for heart disease. What should I know about cancer screening? Many types of cancers can be detected early and may often be prevented. Depending on your health history and family history, you may need to have cancer screening at various ages. This may include screening for: Colorectal cancer. Prostate cancer. Skin cancer. Lung  cancer. What should I know about heart disease, diabetes, and high blood pressure? Blood pressure and heart disease High blood pressure causes heart disease and increases the risk of stroke. This is more likely to develop in people who have high blood pressure readings, are of African descent, or are overweight. Talk with your health care provider about your target blood pressure readings. Have your blood pressure checked: Every 3-5 years if you are 18-39 years of age. Every year if you are 40 years old or older. If you are between the ages of 65 and 75 and are a current or former smoker, ask your health care provider if you should have a one-time screening for abdominal aortic aneurysm (AAA). Diabetes Have regular diabetes screenings. This checks your fasting blood sugar level. Have the screening done: Once every three years after age 45 if you are at a normal weight and have a low risk for diabetes. More often and at a younger age if you are overweight or have a high risk for diabetes. What should I know about preventing infection? Hepatitis B If you have a higher risk for hepatitis B, you should be screened for this virus. Talk with your health care provider to find out if you are at risk for hepatitis B infection. Hepatitis C Blood testing is recommended for: Everyone born from 1945 through 1965. Anyone with known risk factors for hepatitis C. Sexually transmitted infections (STIs) You should be screened each year for STIs, including gonorrhea and chlamydia, if: You are sexually active and are younger than 47 years of age. You are older than 47 years   of age and your health care provider tells you that you are at risk for this type of infection. Your sexual activity has changed since you were last screened, and you are at increased risk for chlamydia or gonorrhea. Ask your health care provider if you are at risk. Ask your health care provider about whether you are at high risk for HIV.  Your health care provider may recommend a prescription medicine to help prevent HIV infection. If you choose to take medicine to prevent HIV, you should first get tested for HIV. You should then be tested every 3 months for as long as you are taking the medicine. Follow these instructions at home: Lifestyle Do not use any products that contain nicotine or tobacco, such as cigarettes, e-cigarettes, and chewing tobacco. If you need help quitting, ask your health care provider. Do not use street drugs. Do not share needles. Ask your health care provider for help if you need support or information about quitting drugs. Alcohol use Do not drink alcohol if your health care provider tells you not to drink. If you drink alcohol: Limit how much you have to 0-2 drinks a day. Be aware of how much alcohol is in your drink. In the [place] one drink equals one 12 oz bottle of beer (355 mL), one 5 oz glass of wine (148 mL), or one 1 oz glass of hard liquor (44 mL). General instructions Schedule regular health, dental, and eye exams. Stay current with your vaccines. Tell your health care provider if: You often feel depressed. You have ever been abused or do not feel safe at home. Summary Adopting a healthy lifestyle and getting preventive care are important in promoting health and wellness. Follow your health care provider's instructions about healthy diet, exercising, and getting tested or screened for diseases. Follow your health care provider's instructions on monitoring your cholesterol and blood pressure. This information is not intended to replace advice given to you by your health care provider. Make sure you discuss any questions you have with your health care provider. Document Revised: 06/21/2020 Document Reviewed: 04/06/2018 Elsevier Patient Education  2022 [employer]  

## 2021-02-27 NOTE — Progress Notes (Signed)
Subjective:  Patient ID: Eugene Mccoy, male    DOB: May 19, 1975  Age: 45 y.o. MRN: [id number]  CC: Annual Exam and Anemia  This visit occurred during the SARS-CoV-2 public health emergency.  Safety protocols were in place, including screening questions prior to the visit, additional usage of staff PPE, and extensive cleaning of exam room while observing appropriate contact time as indicated for disinfecting solutions.    HPI Eugene Mccoy presents for a CPX and f/up -  He was recently treated for an upper respiratory infection.  He has completed the antibiotics and is no longer taking the cough suppressant.  His father tells me that he is doing well and offers no complaints.   Outpatient Medications Prior to Visit  Medication Sig Dispense Refill   tamsulosin (FLOMAX) 0.4 MG CAPS capsule Take 1 capsule (0.4 mg total) by mouth daily. 30 capsule 3   doxycycline (VIBRA-TABS) 100 MG tablet Take 1 tablet (100 mg total) by mouth 2 (two) times daily. 20 tablet 0   HYDROcodone bit-homatropine (HYCODAN) 5-1.5 MG/5ML syrup Take 5 mLs by mouth every 6 (six) hours as needed for up to 10 days. 180 mL 0   No facility-administered medications prior to visit.    ROS Review of Systems  Constitutional:  Negative for chills, diaphoresis, fatigue and fever.  HENT: Negative.    Eyes: Negative.   Respiratory:  Negative for cough, chest tightness, shortness of breath and wheezing.   Cardiovascular:  Negative for chest pain, palpitations and leg swelling.  Gastrointestinal:  Negative for abdominal pain, blood in stool and nausea.  Endocrine: Negative.   Genitourinary: Negative.  Negative for difficulty urinating, dysuria, hematuria, testicular pain and urgency.  Musculoskeletal: Negative.   Skin: Negative.  Negative for rash.  Neurological: Negative.  Negative for dizziness.  Hematological: Negative.   Psychiatric/Behavioral: Negative.     Objective:  BP 118/74 (BP Location: Left Arm, Patient  Position: Sitting, Cuff Size: Large)   Pulse 77   Temp 98.6 F (37 C) (Oral)   Resp 16   Ht 5\' 9"  (1.753 m)   Wt 204 lb (92.5 kg)   SpO2 97%   BMI 30.13 kg/m   BP Readings from Last 3 Encounters:  02/27/21 118/74  01/03/21 120/70  09/18/20 116/70    Wt Readings from Last 3 Encounters:  02/27/21 204 lb (92.5 kg)  01/03/21 200 lb 12.8 oz (91.1 kg)  09/18/20 180 lb (81.6 kg)    Physical Exam Vitals reviewed. Exam conducted with a chaperone present (his father).  HENT:     Nose: Nose normal.     Mouth/Throat:     Mouth: Mucous membranes are moist.  Eyes:     General: No scleral icterus.    Conjunctiva/sclera: Conjunctivae normal.  Cardiovascular:     Rate and Rhythm: Normal rate and regular rhythm.     Heart sounds: No murmur heard. Pulmonary:     Effort: Pulmonary effort is normal.     Breath sounds: No stridor. No wheezing, rhonchi or rales.  Abdominal:     General: Abdomen is flat.     Palpations: There is no mass.     Tenderness: There is no abdominal tenderness. There is no guarding or rebound.     Hernia: No hernia is present. There is no hernia in the left inguinal area or right inguinal area.  Genitourinary:    Penis: Normal and circumcised.      Testes: Normal.     Epididymis:  Right: Normal.     Prostate: Normal. Not enlarged, not tender and no nodules present.     Rectum: Normal. Guaiac result negative. No mass, tenderness, anal fissure, external hemorrhoid or internal hemorrhoid. Normal anal tone.  Musculoskeletal:        General: Normal range of motion.     Cervical back: Neck supple.     Right lower leg: No edema.     Left lower leg: No edema.  Lymphadenopathy:     Cervical: No cervical adenopathy.     Lower Body: No right inguinal adenopathy. No left inguinal adenopathy.  Skin:    General: Skin is warm and dry.  Neurological:     General: No focal deficit present.     Mental Status: He is alert. Mental status is at baseline.  Psychiatric:         Mood and Affect: Mood normal.        Behavior: Behavior normal.    Lab Results  Component Value Date   WBC 2.1 Repeated and verified X2. (L) 02/27/2021   HGB 12.0 (L) 02/27/2021   HCT 34.7 (L) 02/27/2021   PLT 152.0 02/27/2021   GLUCOSE 83 02/26/2020   CHOL 171 02/27/2021   TRIG 83.0 02/27/2021   HDL 61.40 02/27/2021   LDLCALC 93 02/27/2021   ALT 13 02/26/2020   AST 19 02/26/2020   NA 138 02/26/2020   K 4.4 02/26/2020   CL 104 02/26/2020   CREATININE 1.03 02/26/2020   BUN 15 02/26/2020   CO2 27 02/26/2020   TSH 0.89 02/22/2019   PSA 0.00 (L) 02/27/2021   INR 1.03 12/25/2010   HGBA1C 5.0 02/08/2014    US Abdomen Complete  Result Date: 10/14/2012 *RADIOLOGY REPORT* Clinical Data:  Elevated liver enzymes. ABDOMEN ULTRASOUND Technique:  Complete abdominal ultrasound examination was performed including evaluation of the liver, gallbladder, bile ducts, pancreas, kidneys, spleen, IVC, and abdominal aorta. Comparison:  CT of the abdomen on 12/17/2010. Findings: Gallbladder:  No shadowing gallstones are identified.  Minimal echogenicity within the gallbladder may be consistent with a small amount of biliary sludge.  There is no evidence of gallbladder distention, wall thickening or pericholecystic fluid.  No sonographic Murphy's sign was elicited. Common Bile Duct:  Normal caliber of 3 mm. Liver:  No focal mass lesion seen.  Within normal limits in parenchymal echogenicity. No biliary ductal dilatation identified. IVC:  Very limited visualization of the inferior vena cava. Pancreas:  The pancreas is essentially nonvisualized by ultrasound. Spleen:  The spleen is of normal echotexture and size. Kidneys:  Both kidneys measure approximately 9.6 cm and show no evidence of hydronephrosis or focal lesion. Abdominal Aorta:  The abdominal aorta is poorly visualized. Proximal to mid aorta demonstrate normal caliber. IMPRESSION: Unremarkable abdominal ultrasound.  There may be a small amount of  biliary sludge in the gallbladder. Original Report Authenticated By: Aletta Edouard, M.D.    Assessment & Plan:   Eugene Mccoy was seen today for annual exam and anemia.  Diagnoses and all orders for this visit:  Routine general medical examination at a health care facility- Exam completed, labs reviewed - Statin therapy is not indicated, cancer screenings addressed, vaccines reviewed and updated, patient education was given.  Hyperlipidemia with target LDL less than 130- Statin therapy is not indicated. -     Lipid panel; Future -     Lipid panel  Pancytopenia ([place])- I have asked him to follow-up with hematology again. -     CBC with Differential/Platelet; Future -  CBC with Differential/Platelet -     Ambulatory referral to Hematology / Oncology  Benign prostatic hyperplasia without lower urinary tract symptoms- His PSA is undetectable. -     PSA; Future -     PSA  Need for prophylactic vaccination with combined diphtheria-tetanus-pertussis (DTP) vaccine -     Tdap (BOOSTRIX) 5-2.5-18.5 LF-MCG/0.5 injection; Inject 0.5 mLs into the muscle once for 1 dose.  Screen for colon cancer -     Ambulatory referral to Gastroenterology  Other orders -     Flu Vaccine QUAD 6+ mos PF IM (Fluarix Quad PF)  I have discontinued Skanda N. Villacres's doxycycline and HYDROcodone bit-homatropine. I am also having him start on Boostrix. Additionally, I am having him maintain his tamsulosin.  Meds ordered this encounter  Medications   Tdap (BOOSTRIX) 5-2.5-18.5 LF-MCG/0.5 injection    Sig: Inject 0.5 mLs into the muscle once for 1 dose.    Dispense:  0.5 mL    Refill:  0     Follow-up: Return in about 6 months (around 08/27/2021).  [name], MD

## 2021-03-03 ENCOUNTER — Telehealth: Payer: Self-pay | Admitting: Oncology

## 2021-03-03 NOTE — Telephone Encounter (Signed)
Scheduled appt per 11/3 referral. Spoke to pt's father who is aware of appt date and time.

## 2021-03-06 ENCOUNTER — Other Ambulatory Visit: Payer: Self-pay

## 2021-03-06 ENCOUNTER — Inpatient Hospital Stay: Payer: Medicare Other | Attending: Oncology | Admitting: Oncology

## 2021-03-06 VITALS — BP 129/74 | HR 77 | Temp 98.0°F | Resp 17 | Ht 69.0 in | Wt 203.7 lb

## 2021-03-06 DIAGNOSIS — D7589 Other specified diseases of blood and blood-forming organs: Secondary | ICD-10-CM | POA: Diagnosis not present

## 2021-03-06 DIAGNOSIS — D61818 Other pancytopenia: Secondary | ICD-10-CM

## 2021-03-06 DIAGNOSIS — D709 Neutropenia, unspecified: Secondary | ICD-10-CM | POA: Insufficient documentation

## 2021-03-06 NOTE — Progress Notes (Signed)
Hematology and Oncology Follow Up Visit  Eugene Mccoy [id number] 1975/10/23 45 y.o. 03/06/2021 10:16 AM  CC: [name], MD   Principle Diagnosis: 45 year old man with benign fluctuating neutropenia diagnosed in 2012.  No evidence of other hematological condition.   Current therapy: Active surveillance.  Interim History: Mr. Greulich is here for a follow-up visit with his parents.  Since last visit, no major complaints reported.  He is currently on Flomax because of urinary frequency urgency which has improved with medication.  He denies any nausea, vomiting or abdominal pain.  He denies any bone pain or pathological fractures.  He denies any excessive fatigue, tiredness or weakness.  He continues to work at [employer].     Medications: Updated on review.  Current Outpatient Medications  Medication Sig Dispense Refill   tamsulosin (FLOMAX) 0.4 MG CAPS capsule Take 1 capsule (0.4 mg total) by mouth daily. 30 capsule 3   No current facility-administered medications for this visit.      Allergies: No Known Allergies     Physical Exam:  Blood pressure 129/74, pulse 77, temperature 98 F (36.7 C), temperature source Temporal, resp. rate 17, height 5' 9"  (1.753 m), weight 203 lb 11.2 oz (92.4 kg), SpO2 97 %.   ECOG: 0    General appearance: Alert, awake without any distress. Head: Atraumatic without abnormalities Oropharynx: Without any thrush or ulcers. Eyes: No scleral icterus. Lymph nodes: No lymphadenopathy noted in the cervical, supraclavicular, or axillary nodes Heart:regular rate and rhythm, without any murmurs or gallops.   Lung: Clear to auscultation without any rhonchi, wheezes or dullness to percussion. Abdomin: Soft, nontender without any shifting dullness or ascites. Musculoskeletal: No clubbing or cyanosis. Neurological: No motor or sensory deficits. Skin: No rashes or lesions. '    Lab Results: Lab Results  Component Value Date   WBC 2.1  Repeated and verified X2. (L) 02/27/2021   HGB 12.0 (L) 02/27/2021   HCT 34.7 (L) 02/27/2021   MCV 103.5 (H) 02/27/2021   PLT 152.0 02/27/2021     Chemistry      Component Value Date/Time   NA 138 02/26/2020 1146   NA 139 10/08/2015 0956   K 4.4 02/26/2020 1146   K 4.3 10/08/2015 0956   CL 104 02/26/2020 1146   CL 107 08/26/2012 0905   CO2 27 02/26/2020 1146   CO2 25 10/08/2015 0956   BUN 15 02/26/2020 1146   BUN 14.7 10/08/2015 0956   CREATININE 1.03 02/26/2020 1146   CREATININE 1.24 07/06/2018 0938   CREATININE 1.3 10/08/2015 0956      Component Value Date/Time   CALCIUM 9.8 02/26/2020 1146   CALCIUM 10.1 10/08/2015 0956   ALKPHOS 49 02/26/2020 1146   ALKPHOS 55 10/08/2015 0956   AST 19 02/26/2020 1146   AST 22 07/06/2018 0938   AST 24 10/08/2015 0956   ALT 13 02/26/2020 1146   ALT 22 07/06/2018 0938   ALT 26 10/08/2015 0956   BILITOT 0.4 02/26/2020 1146   BILITOT 0.4 07/06/2018 0938   BILITOT 0.62 10/08/2015 592       45 year old man with:  Impression and plan:   1.  Benign fluctuating neutropenia diagnosed in 2012.    Laboratory data in the last 10 years were reviewed and showed white cell count that has been consistent with his baseline with absolute neutrophil count ranges between 700 and 1000. The differential diagnosis includes benign ethnic variation, medication, autoimmune disease and less likely a primary bone marrow disorder.  Given the chronicity of these findings unlikely were dealing with a blood disorder.  2. Macrocytosis: Hemoglobin continues to be adequate and close to baseline fluctuating between 12 and 13.  Microcytosis continues to be chronic and stable.  Bone marrow biopsy obtained in 2012 did not show any hematological condition and his CBC continues to be consistent with his baseline.  I see no need to repeat this evaluation at this time.  But I do recommend annual monitoring to resume.  3. Follow-up: In 1 year for repeat follow-up.  30  minutes were dedicated to this encounter.  Time spent on reviewing laboratory data, reviewing differential diagnosis and management choices in the future.   Zola Button, MD 11/10/202210:16 AM

## 2021-04-04 ENCOUNTER — Encounter: Payer: Self-pay | Admitting: Gastroenterology

## 2021-06-06 ENCOUNTER — Telehealth: Payer: Self-pay | Admitting: *Deleted

## 2021-06-06 NOTE — Telephone Encounter (Signed)
Message left along with call back number to call and reschedule pre-visit by 5 pm today or procedure will have to be cancelled.attempted to call x 2.

## 2021-06-06 NOTE — Telephone Encounter (Signed)
No return call received procedure cancelled and no show letter sent.

## 2021-06-20 ENCOUNTER — Encounter: Payer: Medicare Other | Admitting: Gastroenterology

## 2021-07-04 ENCOUNTER — Telehealth: Payer: Self-pay | Admitting: *Deleted

## 2021-07-04 NOTE — Telephone Encounter (Signed)
No return call received procedure and pre-visit cancelled letter sent. ?

## 2021-07-04 NOTE — Telephone Encounter (Signed)
Message left along with call back number to return call by 5 pm today to reschedule nurse visit or upcoming procedure on 07/18/21 will be cancelled.attempted to contact x3. ?

## 2021-07-06 ENCOUNTER — Other Ambulatory Visit: Payer: Self-pay | Admitting: Internal Medicine

## 2021-07-18 ENCOUNTER — Encounter: Payer: Medicare Other | Admitting: Gastroenterology

## 2021-07-23 DIAGNOSIS — H903 Sensorineural hearing loss, bilateral: Secondary | ICD-10-CM | POA: Diagnosis not present

## 2021-07-30 ENCOUNTER — Encounter: Payer: Self-pay | Admitting: Gastroenterology

## 2021-07-30 ENCOUNTER — Other Ambulatory Visit: Payer: Self-pay

## 2021-07-30 ENCOUNTER — Ambulatory Visit (AMBULATORY_SURGERY_CENTER): Payer: Medicare Other | Admitting: *Deleted

## 2021-07-30 VITALS — Ht 69.0 in | Wt 212.0 lb

## 2021-07-30 DIAGNOSIS — Z1211 Encounter for screening for malignant neoplasm of colon: Secondary | ICD-10-CM

## 2021-07-30 NOTE — Progress Notes (Signed)
Spoke with father over the phone to do PV.  Father states pt is able to answer questions but has difficulty speaking clearly due to hearing loss.  Father answers medical questions and given prep instructions.  Pt lives with father so he will help pt prep for colonoscopy ? ?Pt has no trouble in moving neck ? ?Pt's previsit is done over the phone and all paperwork (prep instructions, blank consent form to just read over) sent to patient.  Pt's name and DOB verified at the beginning of the previsit.  Pt denies any difficulty with ambulating.  ? ? ?No egg or soy allergy ? ?No home oxygen use  ? ?No medications for weight loss taken ? ?No constipation issues ?

## 2021-08-11 ENCOUNTER — Telehealth: Payer: Self-pay | Admitting: Internal Medicine

## 2021-08-11 NOTE — Telephone Encounter (Signed)
Left message for patient to call back to schedule Medicare Annual Wellness Visit   Last AWV  08/08/20  Please schedule at anytime with [facility]-Nurse Health Advisor if patient calls the office back.      Any questions, please call me at [phone]  

## 2021-08-13 ENCOUNTER — Encounter: Payer: Self-pay | Admitting: Gastroenterology

## 2021-08-13 ENCOUNTER — Ambulatory Visit (AMBULATORY_SURGERY_CENTER): Payer: Medicare Other | Admitting: Gastroenterology

## 2021-08-13 VITALS — BP 122/68 | HR 61 | Temp 96.6°F | Resp 14 | Ht 69.0 in | Wt 212.0 lb

## 2021-08-13 DIAGNOSIS — D123 Benign neoplasm of transverse colon: Secondary | ICD-10-CM

## 2021-08-13 DIAGNOSIS — Z1211 Encounter for screening for malignant neoplasm of colon: Secondary | ICD-10-CM

## 2021-08-13 DIAGNOSIS — D124 Benign neoplasm of descending colon: Secondary | ICD-10-CM | POA: Diagnosis not present

## 2021-08-13 MED ORDER — SODIUM CHLORIDE 0.9 % IV SOLN: 500.0000 mL | Freq: Once | INTRAVENOUS | Status: DC

## 2021-08-13 NOTE — Op Note (Signed)
[facility] ?Patient Name: Eugene Mccoy ?Procedure Date: 08/13/2021 3:17 PM ?MRN: [id number] ?Endoscopist:  E. Candis Schatz , MD ?Age: 46 ?Referring MD:  ?Date of Birth: 1976/01/15 ?Gender: Male ?Account #: [id number] ?Procedure:                Colonoscopy ?Indications:              Screening for colorectal malignant neoplasm, This  ?                          is the patient's first colonoscopy ?Medicines:                Monitored Anesthesia Care ?Procedure:                Pre-Anesthesia Assessment: ?                          - Prior to the procedure, a History and Physical  ?                          was performed, and patient medications and  ?                          allergies were reviewed. The patient's tolerance of  ?                          previous anesthesia was also reviewed. The risks  ?                          and benefits of the procedure and the sedation  ?                          options and risks were discussed with the patient.  ?                          All questions were answered, and informed consent  ?                          was obtained. Prior Anticoagulants: The patient has  ?                          taken no previous anticoagulant or antiplatelet  ?                          agents. ASA Grade Assessment: II - A patient with  ?                          mild systemic disease. After reviewing the risks  ?                          and benefits, the patient was deemed in  ?                          satisfactory condition to undergo the procedure. ?  After obtaining informed consent, the colonoscope  ?                          was passed under direct vision. Throughout the  ?                          procedure, the patient's blood pressure, pulse, and  ?                          oxygen saturations were monitored continuously. The  ?                          CF [id number] #[id number] was introduced through the anus  ?                          and advanced to the  the cecum, identified by  ?                          appendiceal orifice and ileocecal valve. The  ?                          colonoscopy was somewhat difficult due to  ?                          significant looping. Successful completion of the  ?                          procedure was aided by using manual pressure. The  ?                          patient tolerated the procedure well. The quality  ?                          of the bowel preparation was adequate. The  ?                          ileocecal valve, appendiceal orifice, and rectum  ?                          were photographed. ?Scope In: 3:22:41 PM ?Scope Out: 4:01:18 PM ?Scope Withdrawal Time: 0 hours 31 minutes 22 seconds  ?Total Procedure Duration: 0 hours 38 minutes 37 seconds  ?Findings:                 The perianal and digital rectal examinations were  ?                          normal. Pertinent negatives include normal  ?                          sphincter tone and no palpable rectal lesions. ?                          A 15 mm polyp was found in the transverse colon.  ?  The polyp was sessile. The polyp was removed with a  ?                          piecemeal technique using a cold snare. Resection  ?                          and retrieval were complete. Estimated blood loss  ?                          was minimal. ?                          A 4 mm polyp was found in the transverse colon. The  ?                          polyp was sessile. The polyp was removed with a  ?                          cold snare. Resection and retrieval were complete.  ?                          Estimated blood loss was minimal. ?                          Two pedunculated and sessile polyps were found in  ?                          the splenic flexure. The polyps were 4 to 7 mm in  ?                          size. These polyps were removed with a cold snare.  ?                          Resection and retrieval were complete. Estimated  ?                           blood loss was minimal. ?                          Two pedunculated and sessile polyps were found in  ?                          the descending colon. The polyps were 3 to 12 mm in  ?                          size. These polyps were removed with a cold snare.  ?                          Resection and retrieval were complete. Estimated  ?                          blood loss was minimal. ?  The exam was otherwise normal throughout the  ?                          examined colon. ?                          The retroflexed view of the distal rectum and anal  ?                          verge was normal and showed no anal or rectal  ?                          abnormalities. ?Complications:            No immediate complications. ?Estimated Blood Loss:     Estimated blood loss was minimal. ?Impression:               - One 15 mm polyp in the transverse colon, removed  ?                          piecemeal using a cold snare. Resected and  ?                          retrieved. ?                          - One 4 mm polyp in the transverse colon, removed  ?                          with a cold snare. Resected and retrieved. ?                          - Two 4 to 7 mm polyps at the splenic flexure,  ?                          removed with a cold snare. Resected and retrieved. ?                          - Two 3 to 12 mm polyps in the descending colon,  ?                          removed with a cold snare. Resected and retrieved. ?                          - The distal rectum and anal verge are normal on  ?                          retroflexion view. ?Recommendation:           - Patient has a contact number available for  ?                          emergencies. The signs and symptoms of potential  ?  delayed complications were discussed with the  ?                          patient. Return to normal activities tomorrow.  ?                          Written discharge  instructions were provided to the  ?                          patient. ?                          - Resume previous diet. ?                          - Continue present medications. ?                          - Await pathology results. ?                          - Repeat colonoscopy (date not yet determined) for  ?                          surveillance based on pathology results. ? E. Candis Schatz, MD ?08/13/2021 4:09:43 PM ?This report has been signed electronically. ?

## 2021-08-13 NOTE — Progress Notes (Signed)
[facility] History and Physical ? ? ?Primary Care Physician:  Janith Lima, MD ? ? ?Reason for Procedure:   Colon cancer screening ? ?Plan:    Screening colonoscopy ? ? ? ? ?HPI: Eugene Mccoy is a 46 y.o. male undergoing initial average risk screening colonoscopy.  He has no family history of colon cancer and no chronic GI symptoms.  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 ? ? ?Past Medical History:  ?Diagnosis Date  ? Deafness   ? Leukocytopenia   ? 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 ? ?Past Surgical History:  ?Procedure Laterality Date  ? leukocytopenia    ? TONSILLECTOMY    ? tubes in ears    ? at 36 months old  ? ? ?Prior to Admission medications   ?Medication Sig Start Date End Date Taking? Authorizing Provider  ?tamsulosin (FLOMAX) 0.4 MG CAPS capsule TAKE 1 CAPSULE BY MOUTH EVERY DAY 07/07/21   Janith Lima, MD  ? ? ?Current Outpatient Medications  ?Medication Sig Dispense Refill  ? tamsulosin (FLOMAX) 0.4 MG CAPS capsule TAKE 1 CAPSULE BY MOUTH EVERY DAY 90 capsule 1  ? ?No current facility-administered medications for this visit.  ? ? ?Allergies as of 08/13/2021  ? (No Known Allergies)  ? ? ?Family History  ?Problem Relation Age of Onset  ? Diabetes Other   ?     1st degree relative  ? Colon cancer Neg Hx   ? Esophageal cancer Neg Hx   ? Rectal cancer Neg Hx   ? Stomach cancer Neg Hx   ? ? ?Social History  ? ?Socioeconomic History  ? Marital status: Single  ?  Spouse name: Not on file  ? Number of children: Not on file  ? Years of education: Not on file  ? Highest education level: Not on file  ?Occupational History  ? Occupation: Food services  ?  Employer: [employer]  ?Tobacco Use  ? Smoking status: Never  ? Smokeless tobacco: Never  ?Vaping Use  ? Vaping Use: Never used  ?Substance and Sexual Activity  ? Alcohol use: No  ? Drug use: No  ? Sexual activity: Not Currently  ?Other Topics Concern   ? Not on file  ?Social History Narrative  ? Regular exercise-no  ?   ?   ?   ? ?Social Determinants of Health  ? ?Financial Resource Strain: Not on file  ?Food Insecurity: Not on file  ?Transportation Needs: Not on file  ?Physical Activity: Not on file  ?Stress: Not on file  ?Social Connections: Not on file  ?Intimate Partner Violence: Not on file  ? ? ?Review of Systems: ? ?All other review of systems negative except as mentioned in the HPI. ? ?Physical Exam: ?Vital signs ?There were no vitals taken for this visit. ? ?General:   Alert,  Well-developed, well-nourished, pleasant and cooperative in NAD ?Airway:  Mallampati 1 ?Lungs:  Clear throughout to auscultation.   ?Heart:  Regular rate and rhythm; no murmurs, clicks, rubs,  or gallops. ?Abdomen:  Soft, nontender and nondistended. Normal bowel sounds.   ?Neuro/Psych:  Normal mood and affect. A and O x 3 ? ? ? E. Candis Schatz, MD ?[facility] ? ?

## 2021-08-13 NOTE — Progress Notes (Signed)
Called to room to assist during endoscopic procedure.  Patient ID and intended procedure confirmed with present staff. Received instructions for my participation in the procedure from the performing physician.  

## 2021-08-13 NOTE — Progress Notes (Signed)
Pt non-responsive, VVS, Report to RN  °

## 2021-08-13 NOTE — Patient Instructions (Addendum)
Handouts on polyps given. Await pathology results. ?Resume previous diet and continue present medications. ?Repeat colonoscopy for surveillance will be determined based off of pathology results. ? ? ?YOU HAD AN ENDOSCOPIC PROCEDURE TODAY AT [facility]:   Refer to the procedure report that was given to you for any specific questions about what was found during the examination.  If the procedure report does not answer your questions, please call your gastroenterologist to clarify.  If you requested that your care partner not be given the details of your procedure findings, then the procedure report has been included in a sealed envelope for you to review at your convenience later. ? ?YOU SHOULD EXPECT: Some feelings of bloating in the abdomen. Passage of more gas than usual.  Walking can help get rid of the air that was put into your GI tract during the procedure and reduce the bloating. If you had a lower endoscopy (such as a colonoscopy or flexible sigmoidoscopy) you may notice spotting of blood in your stool or on the toilet paper. If you underwent a bowel prep for your procedure, you may not have a normal bowel movement for a few days. ? ?Please Note:  You might notice some irritation and congestion in your nose or some drainage.  This is from the oxygen used during your procedure.  There is no need for concern and it should clear up in a day or so. ? ?SYMPTOMS TO REPORT IMMEDIATELY: ? ?Following lower endoscopy (colonoscopy or flexible sigmoidoscopy): ? Excessive amounts of blood in the stool ? Significant tenderness or worsening of abdominal pains ? Swelling of the abdomen that is new, acute ? Fever of 100?F or higher ? ?For urgent or emergent issues, a gastroenterologist can be reached at any hour by calling [phone]. ?Do not use MyChart messaging for urgent concerns.  ? ? ?DIET:  We do recommend a small meal at first, but then you may proceed to your regular diet.  Drink plenty of  fluids but you should avoid alcoholic beverages for 24 hours. ? ?ACTIVITY:  You should plan to take it easy for the rest of today and you should NOT DRIVE or use heavy machinery until tomorrow (because of the sedation medicines used during the test).   ? ?FOLLOW UP: ?Our staff will call the number listed on your records 48-72 hours following your procedure to check on you and address any questions or concerns that you may have regarding the information given to you following your procedure. If we do not reach you, we will leave a message.  We will attempt to reach you two times.  During this call, we will ask if you have developed any symptoms of COVID 19. If you develop any symptoms (ie: fever, flu-like symptoms, shortness of breath, cough etc.) before then, please call [phone].  If you test positive for Covid 19 in the 2 weeks post procedure, please call and report this information to [place].   ? ?If any biopsies were taken you will be contacted by phone or by letter within the next 1-3 weeks.  Please call us at [phone] if you have not heard about the biopsies in 3 weeks.  ? ? ?SIGNATURES/CONFIDENTIALITY: ?You and/or your care partner have signed paperwork which will be entered into your electronic medical record.  These signatures attest to the fact that that the information above on your After Visit Summary has been reviewed and is understood.  Full responsibility of the confidentiality of this  discharge information lies with you and/or your care-partner.  ?

## 2021-08-15 ENCOUNTER — Telehealth: Payer: Self-pay

## 2021-08-15 NOTE — Telephone Encounter (Signed)
?  Follow up Call- ? ? ?  08/13/2021  ?  3:02 PM  ?Call back number  ?Post procedure Call Back phone  # [phone]  ?Permission to leave phone message Yes  ?  ? ?Patient questions: ? ?Do you have a fever, pain , or abdominal swelling? No. ?Pain Score  0 * ? ?Have you tolerated food without any problems? Yes.   ? ?Have you been able to return to your normal activities? Yes.   ? ?Do you have any questions about your discharge instructions: ?Diet   No. ?Medications  No. ?Follow up visit  No. ? ?Do you have questions or concerns about your Care? No.  Pt.'s father answered on behalf of pt.   ? ?Actions: ?* If pain score is 4 or above: ?No action needed, pain <4. ? ? ?

## 2021-08-19 ENCOUNTER — Encounter: Payer: Self-pay | Admitting: Gastroenterology

## 2021-08-21 ENCOUNTER — Ambulatory Visit (INDEPENDENT_AMBULATORY_CARE_PROVIDER_SITE_OTHER): Payer: Medicare Other

## 2021-08-21 DIAGNOSIS — Z0000 Encounter for general adult medical examination without abnormal findings: Secondary | ICD-10-CM | POA: Diagnosis not present

## 2021-08-21 NOTE — Patient Instructions (Signed)
Mr. Segundo , ?Thank you for taking time to come for your Medicare Wellness Visit. I appreciate your ongoing commitment to your health goals. Please review the following plan we discussed and let me know if I can assist you in the future.  ? ?Screening recommendations/referrals: ?Colonoscopy: 08/13/2021; due every 10 years ?Recommended yearly ophthalmology/optometry visit for glaucoma screening and checkup ?Recommended yearly dental visit for hygiene and checkup ? ?Vaccinations: ?Influenza vaccine: 02/27/2021 ?Pneumococcal vaccine: 48 years & older ?Tdap vaccine: overdue ?Shingles vaccine: 48 years & older   ?Covid-19: 07/01/2019, 07/22/2019, 03/01/2020 ? ?Advanced directives: No ? ?Conditions/risks identified: Yes ? ?Next appointment: 08/19/2022 at 3:30 p.m. telephone visit with Mignon Pine, Nurse Health Advisor.  If you need to reschedule or cancel, please call [phone]. ? ?Preventive Care 48-64 Years, Male ?Preventive care refers to lifestyle choices and visits with your health care provider that can promote health and wellness. ?What does preventive care include? ?A yearly physical exam. This is also called an annual well check. ?Dental exams once or twice a year. ?Routine eye exams. Ask your health care provider how often you should have your eyes checked. ?Personal lifestyle choices, including: ?Daily care of your teeth and gums. ?Regular physical activity. ?Eating a healthy diet. ?Avoiding tobacco and drug use. ?Limiting alcohol use. ?Practicing safe sex. ?Taking low-dose aspirin every day starting at age 48. ?What happens during an annual well check? ?The services and screenings done by your health care provider during your annual well check will depend on your age, overall health, lifestyle risk factors, and family history of disease. ?Counseling  ?Your health care provider may ask you questions about your: ?Alcohol use. ?Tobacco use. ?Drug use. ?Emotional well-being. ?Home and relationship well-being. ?Sexual  activity. ?Eating habits. ?Work and work Statistician. ?Screening  ?You may have the following tests or measurements: ?Height, weight, and BMI. ?Blood pressure. ?Lipid and cholesterol levels. These may be checked every 5 years, or more frequently if you are over 24 years old. ?Skin check. ?Lung cancer screening. You may have this screening every year starting at age 48 if you have a 30-pack-year history of smoking and currently smoke or have quit within the past 15 years. ?Fecal occult blood test (FOBT) of the stool. You may have this test every year starting at age 48. ?Flexible sigmoidoscopy or colonoscopy. You may have a sigmoidoscopy every 5 years or a colonoscopy every 10 years starting at age 48. ?Prostate cancer screening. Recommendations will vary depending on your family history and other risks. ?Hepatitis C blood test. ?Hepatitis B blood test. ?Sexually transmitted disease (STD) testing. ?Diabetes screening. This is done by checking your blood sugar (glucose) after you have not eaten for a while (fasting). You may have this done every 1-3 years. ?Discuss your test results, treatment options, and if necessary, the need for more tests with your health care provider. ?Vaccines  ?Your health care provider may recommend certain vaccines, such as: ?Influenza vaccine. This is recommended every year. ?Tetanus, diphtheria, and acellular pertussis (Tdap, Td) vaccine. You may need a Td booster every 10 years. ?Zoster vaccine. You may need this after age 48. ?Pneumococcal 13-valent conjugate (PCV13) vaccine. You may need this if you have certain conditions and have not been vaccinated. ?Pneumococcal polysaccharide (PPSV23) vaccine. You may need one or two doses if you smoke cigarettes or if you have certain conditions. ?Talk to your health care provider about which screenings and vaccines you need and how often you need them. ?This information is not intended to replace  advice given to you by your health care provider.  Make sure you discuss any questions you have with your health care provider. ?Document Released: 05/10/2015 Document Revised: 01/01/2016 Document Reviewed: 02/12/2015 ?Elsevier Interactive Patient Education ? 2017 [employer]. ? ?Fall Prevention in the Home ?Falls can cause injuries. They can happen to people of all ages. There are many things you can do to make your home safe and to help prevent falls. ?What can I do on the outside of my home? ?Regularly fix the edges of walkways and driveways and fix any cracks. ?Remove anything that might make you trip as you walk through a door, such as a raised step or threshold. ?Trim any bushes or trees on the path to your home. ?Use bright outdoor lighting. ?Clear any walking paths of anything that might make someone trip, such as rocks or tools. ?Regularly check to see if handrails are loose or broken. Make sure that both sides of any steps have handrails. ?Any raised decks and porches should have guardrails on the edges. ?Have any leaves, snow, or ice cleared regularly. ?Use sand or salt on walking paths during winter. ?Clean up any spills in your garage right away. This includes oil or grease spills. ?What can I do in the bathroom? ?Use night lights. ?Install grab bars by the toilet and in the tub and shower. Do not use towel bars as grab bars. ?Use non-skid mats or decals in the tub or shower. ?If you need to sit down in the shower, use a plastic, non-slip stool. ?Keep the floor dry. Clean up any water that spills on the floor as soon as it happens. ?Remove soap buildup in the tub or shower regularly. ?Attach bath mats securely with double-sided non-slip rug tape. ?Do not have throw rugs and other things on the floor that can make you trip. ?What can I do in the bedroom? ?Use night lights. ?Make sure that you have a light by your bed that is easy to reach. ?Do not use any sheets or blankets that are too big for your bed. They should not hang down onto the floor. ?Have a  firm chair that has side arms. You can use this for support while you get dressed. ?Do not have throw rugs and other things on the floor that can make you trip. ?What can I do in the kitchen? ?Clean up any spills right away. ?Avoid walking on wet floors. ?Keep items that you use a lot in easy-to-reach places. ?If you need to reach something above you, use a strong step stool that has a grab bar. ?Keep electrical cords out of the way. ?Do not use floor polish or wax that makes floors slippery. If you must use wax, use non-skid floor wax. ?Do not have throw rugs and other things on the floor that can make you trip. ?What can I do with my stairs? ?Do not leave any items on the stairs. ?Make sure that there are handrails on both sides of the stairs and use them. Fix handrails that are broken or loose. Make sure that handrails are as long as the stairways. ?Check any carpeting to make sure that it is firmly attached to the stairs. Fix any carpet that is loose or worn. ?Avoid having throw rugs at the top or bottom of the stairs. If you do have throw rugs, attach them to the floor with carpet tape. ?Make sure that you have a light switch at the top of the stairs and the bottom  of the stairs. If you do not have them, ask someone to add them for you. ?What else can I do to help prevent falls? ?Wear shoes that: ?Do not have high heels. ?Have rubber bottoms. ?Are comfortable and fit you well. ?Are closed at the toe. Do not wear sandals. ?If you use a stepladder: ?Make sure that it is fully opened. Do not climb a closed stepladder. ?Make sure that both sides of the stepladder are locked into place. ?Ask someone to hold it for you, if possible. ?Clearly mark and make sure that you can see: ?Any grab bars or handrails. ?First and last steps. ?Where the edge of each step is. ?Use tools that help you move around (mobility aids) if they are needed. These include: ?Canes. ?Walkers. ?Scooters. ?Crutches. ?Turn on the lights when you  go into a dark area. Replace any light bulbs as soon as they burn out. ?Set up your furniture so you have a clear path. Avoid moving your furniture around. ?If any of your floors are uneven, fix them. ?If

## 2021-08-21 NOTE — Progress Notes (Signed)
?I connected with Eugene Mccoy today by telephone and verified that I am speaking with the correct person using two identifiers. ?Location patient: home ?Location provider: work ?Persons participating in the virtual visit: patient, father and provider. ?  ?I discussed the limitations, risks, security and privacy concerns of performing an evaluation and management service by telephone and the availability of in person appointments. I also discussed with the patient that there may be a patient responsible charge related to this service. The patient expressed understanding and verbally consented to this telephonic visit.  ?  ?Interactive audio and video telecommunications were attempted between this provider and patient, however failed, due to patient having technical difficulties OR patient did not have access to video capability.  We continued and completed visit with audio only. ? ?Some vital signs may be absent or patient reported.  ? ?Time Spent with patient on telephone encounter: 30 minutes ? ?Subjective:  ? Eugene Mccoy is a 46 y.o. male who presents for Medicare Annual/Subsequent preventive examination. ? ?Review of Systems    ? ?Cardiac Risk Factors include: dyslipidemia;male gender ? ?   ?Objective:  ?  ?There were no vitals filed for this visit. ?There is no height or weight on file to calculate BMI. ? ? ?  08/21/2021  ?  4:15 PM 08/08/2020  ? 10:51 AM 07/06/2018  ?  9:59 AM 10/01/2016  ?  8:13 AM 07/07/2016  ?  9:53 AM 10/08/2015  ? 10:13 AM 07/20/2015  ? 10:27 AM  ?Advanced Directives  ?Does Patient Have a Medical Advance Directive? No No Yes Yes;No No No No  ?Does patient want to make changes to medical advance directive?    No - Patient declined     ?Would patient like information on creating a medical advance directive? No - Patient declined No - Patient declined  No - Patient declined  No - patient declined information No - patient declined information  ? ? ?Current Medications (verified) ?Outpatient  Encounter Medications as of 08/21/2021  ?Medication Sig  ? tamsulosin (FLOMAX) 0.4 MG CAPS capsule TAKE 1 CAPSULE BY MOUTH EVERY DAY  ? ?No facility-administered encounter medications on file as of 08/21/2021.  ? ? ?Allergies (verified) ?Patient has no known allergies.  ? ?History: ?Past Medical History:  ?Diagnosis Date  ? Deafness   ? Leukocytopenia   ? 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 ?Past Surgical History:  ?Procedure Laterality Date  ? leukocytopenia    ? TONSILLECTOMY    ? tubes in ears    ? at 34 months old  ? ?Family History  ?Problem Relation Age of Onset  ? Diabetes Other   ?     1st degree relative  ? Colon cancer Neg Hx   ? Esophageal cancer Neg Hx   ? Rectal cancer Neg Hx   ? Stomach cancer Neg Hx   ? ?Social History  ? ?Socioeconomic History  ? Marital status: Single  ?  Spouse name: Not on file  ? Number of children: Not on file  ? Years of education: Not on file  ? Highest education level: Not on file  ?Occupational History  ? Occupation: Food services  ?  Employer: [employer]  ?Tobacco Use  ? Smoking status: Never  ? Smokeless tobacco: Never  ?Vaping Use  ? Vaping Use: Never used  ?Substance and Sexual Activity  ? Alcohol use: No  ? Drug use: No  ? Sexual activity: Not Currently  ?Other Topics Concern  ?  Not on file  ?Social History Narrative  ? Regular exercise-no  ?   ?   ?   ? ?Social Determinants of Health  ? ?Financial Resource Strain: Low Risk   ? Difficulty of Paying Living Expenses: Not hard at all  ?Food Insecurity: No Food Insecurity  ? Worried About Charity fundraiser in the Last Year: Never true  ? Ran Out of Food in the Last Year: Never true  ?Transportation Needs: No Transportation Needs  ? Lack of Transportation (Medical): No  ? Lack of Transportation (Non-Medical): No  ?Physical Activity: Inactive  ? Days of Exercise per Week: 0 days  ? Minutes of Exercise per Session: 0 min  ?Stress: No Stress Concern Present  ? Feeling of Stress : Not at all  ?Social Connections:  Moderately Integrated  ? Frequency of Communication with Friends and Family: More than three times a week  ? Frequency of Social Gatherings with Friends and Family: More than three times a week  ? Attends Religious Services: More than 4 times per year  ? Active Member of Clubs or Organizations: Yes  ? Attends Archivist Meetings: More than 4 times per year  ? Marital Status: Never married  ? ? ?Tobacco Counseling ?Counseling given: Not Answered ? ? ?Clinical Intake: ? ?Pre-visit preparation completed: Yes ? ?Pain : No/denies pain ? ?  ? ?Nutritional Risks: None ?Diabetes: No ? ?How often do you need to have someone help you when you read instructions, pamphlets, or other written materials from your doctor or pharmacy?: 1 - Never ?What is the last grade level you completed in school?: High School ? ?Diabetic? no ? ?Interpreter Needed?: No ? ?Information entered by :: Lisette Abu, LPN. ? ? ?Activities of Daily Living ? ?  08/21/2021  ?  4:16 PM  ?In your present state of health, do you have any difficulty performing the following activities:  ?Hearing? 1  ?Vision? 0  ?Difficulty concentrating or making decisions? 1  ?Walking or climbing stairs? 0  ?Dressing or bathing? 0  ?Doing errands, shopping? 0  ?Preparing Food and eating ? N  ?Using the Toilet? N  ?In the past six months, have you accidently leaked urine? N  ?Do you have problems with loss of bowel control? N  ?Managing your Medications? N  ?Managing your Finances? N  ?Housekeeping or managing your Housekeeping? N  ? ? ?Patient Care Team: ?Janith Lima, MD as PCP - General ? ?Indicate any recent Medical Services you may have received from other than [facility] providers in the past year (date may be approximate). ? ?   ?Assessment:  ? This is a routine wellness examination for Eugene Mccoy. ? ?Hearing/Vision screen ?Hearing Screening - Comments:: Patient has hearing difficulty and wears hearing aids. ?Vision Screening - Comments:: Patient does not wear any  corrective lenses/contacts.   ? ?Dietary issues and exercise activities discussed: ?Current Exercise Habits: The patient does not participate in regular exercise at present, Exercise limited by: None identified ? ? Goals Addressed   ? ?  ?  ?  ?  ? This Visit's Progress  ?  Patient declined health goal at this time.     ? ?  ?Depression Screen ? ?  08/21/2021  ?  4:13 PM 08/08/2020  ? 10:51 AM 02/22/2019  ?  9:46 AM 10/01/2016  ?  8:15 AM 09/30/2016  ? 11:10 AM 07/20/2015  ? 10:27 AM 02/12/2014  ?  8:16 AM  ?PHQ 2/9 Scores  ?  PHQ - 2 Score 0 0  0 0 0 0  ?PHQ- 9 Score     0    ?Exception Documentation   Medical reason      ?  ?Fall Risk ? ?  08/21/2021  ?  4:16 PM 08/08/2020  ? 10:51 AM 02/22/2019  ?  9:46 AM 10/01/2016  ?  8:15 AM 07/20/2015  ? 10:27 AM  ?Fall Risk   ?Falls in the past year? 0 0 0 No No  ?Number falls in past yr: 0 0 0    ?Injury with Fall? 0 0 0    ?Risk for fall due to : No Fall Risks No Fall Risks     ?Follow up Falls evaluation completed Falls evaluation completed Falls evaluation completed    ? ? ?FALL RISK PREVENTION PERTAINING TO THE HOME: ? ?Any stairs in or around the home? Yes  ?If so, are there any without handrails? No  ?Home free of loose throw rugs in walkways, pet beds, electrical cords, etc? Yes  ?Adequate lighting in your home to reduce risk of falls? Yes  ? ?ASSISTIVE DEVICES UTILIZED TO PREVENT FALLS: ? ?Life alert? No  ?Use of a cane, walker or w/c? No  ?Grab bars in the bathroom? Yes  ?Shower chair or bench in shower? Yes  ?Elevated toilet seat or a handicapped toilet? No  ? ?TIMED UP AND GO: ? ?Was the test performed? No .  ?Length of time to ambulate 10 feet: n/a sec.  ? ?Appearance of gait: Patient not evaluated for gait during this visit. ? ?Cognitive Function: Unable to complete 6CIT ? ?  08/21/2021  ?  4:17 PM 08/08/2020  ? 10:53 AM  ?MMSE - Mini Mental State Exam  ?Not completed: Unable to complete Unable to complete  ? ?  ?  ? ?Immunizations ?Immunization History  ?Administered Date(s)  Administered  ? Influenza,inj,Quad PF,6+ Mos 02/08/2014, 02/22/2019, 02/26/2020, 02/27/2021  ? PFIZER(Purple Top)SARS-COV-2 Vaccination 07/01/2019, 07/22/2019, 03/01/2020  ? Td 05/12/2010  ? ? ?TDAP sta

## 2022-01-07 ENCOUNTER — Other Ambulatory Visit: Payer: Self-pay | Admitting: Internal Medicine

## 2022-03-05 ENCOUNTER — Inpatient Hospital Stay: Payer: Medicare Other | Admitting: Oncology

## 2022-03-05 ENCOUNTER — Other Ambulatory Visit: Payer: Self-pay

## 2022-03-05 ENCOUNTER — Inpatient Hospital Stay: Payer: Medicare Other | Attending: Oncology

## 2022-03-05 VITALS — BP 117/69 | HR 60 | Temp 97.6°F | Resp 15 | Wt 204.9 lb

## 2022-03-05 DIAGNOSIS — D7589 Other specified diseases of blood and blood-forming organs: Secondary | ICD-10-CM | POA: Insufficient documentation

## 2022-03-05 DIAGNOSIS — D61818 Other pancytopenia: Secondary | ICD-10-CM

## 2022-03-05 DIAGNOSIS — D709 Neutropenia, unspecified: Secondary | ICD-10-CM | POA: Insufficient documentation

## 2022-03-05 LAB — CBC WITH DIFFERENTIAL (CANCER CENTER ONLY)
Abs Immature Granulocytes: 0 10*3/uL (ref 0.00–0.07)
Basophils Absolute: 0 10*3/uL (ref 0.0–0.1)
Basophils Relative: 1 %
Eosinophils Absolute: 0 10*3/uL (ref 0.0–0.5)
Eosinophils Relative: 2 %
HCT: 32.8 % — ABNORMAL LOW (ref 39.0–52.0)
Hemoglobin: 12.3 g/dL — ABNORMAL LOW (ref 13.0–17.0)
Immature Granulocytes: 0 %
Lymphocytes Relative: 47 %
Lymphs Abs: 1 10*3/uL (ref 0.7–4.0)
MCH: 37.3 pg — ABNORMAL HIGH (ref 26.0–34.0)
MCHC: 37.5 g/dL — ABNORMAL HIGH (ref 30.0–36.0)
MCV: 99.4 fL (ref 80.0–100.0)
Monocytes Absolute: 0.2 10*3/uL (ref 0.1–1.0)
Monocytes Relative: 10 %
Neutro Abs: 0.8 10*3/uL — ABNORMAL LOW (ref 1.7–7.7)
Neutrophils Relative %: 40 %
Platelet Count: 146 10*3/uL — ABNORMAL LOW (ref 150–400)
RBC: 3.3 MIL/uL — ABNORMAL LOW (ref 4.22–5.81)
RDW: 14.3 % (ref 11.5–15.5)
Smear Review: NORMAL
WBC Count: 2.1 10*3/uL — ABNORMAL LOW (ref 4.0–10.5)
nRBC: 0 % (ref 0.0–0.2)

## 2022-03-05 NOTE — Progress Notes (Signed)
Hematology and Oncology Follow Up Visit  Eugene Mccoy [id number] [birth year] 46 y.o. 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  Bone marrow biopsy at the time of diagnosis did not show any evidence of a hematological disorder.   Current therapy: Active surveillance.  Interim History: Eugene Mccoy returns today for a follow-up visit with his parents.  He is a pleasant gentleman with developmental disability but remains reasonably functional.  He is nonverbal and unable to provide much history but according to his parents he has not had any health concerns.  He denies any nausea, vomiting or abdominal pain.  He does report urinary frequency.  He has had also some issues with incontinence and has follow-up with Dr. Ronnald Ramp regarding this issue.  He denies any recurrence infections or hospitalizations.     Medications: Updated on review.  Current Outpatient Medications  Medication Sig Dispense Refill   tamsulosin (FLOMAX) 0.4 MG CAPS capsule TAKE 1 CAPSULE BY MOUTH EVERY DAY 90 capsule 0   No current facility-administered medications for this visit.      Allergies: No Known Allergies     Physical Exam:  Blood pressure 117/69, pulse 60, temperature 97.6 F (36.4 C), temperature source Temporal, resp. rate 15, weight 204 lb 14.4 oz (92.9 kg), SpO2 100 %.   ECOG: 0    General appearance: Comfortable appearing without any discomfort Head: Normocephalic without any trauma Oropharynx: Mucous membranes are moist and pink without any thrush or ulcers. Eyes: Pupils are equal and round reactive to light. Lymph nodes: No cervical, supraclavicular, inguinal or axillary lymphadenopathy.   Heart:regular rate and rhythm.  S1 and S2 without leg edema. Lung: Clear without any rhonchi or wheezes.  No dullness to percussion. Abdomin: Soft,  nontender, nondistended with good bowel sounds.  No hepatosplenomegaly. Musculoskeletal: No joint deformity or effusion.  Full range of motion noted. Neurological: No deficits noted on motor, sensory and deep tendon reflex exam. Skin: No petechial rash or dryness.  Appeared moist.       Lab Results: Lab Results  Component Value Date   WBC 2.1 (L) 03/05/2022   HGB 12.3 (L) 03/05/2022   HCT 32.8 (L) 03/05/2022   MCV 99.4 03/05/2022   PLT 146 (L) 03/05/2022     Chemistry      Component Value Date/Time   NA 138 02/26/2020 1146   NA 139 10/08/2015 0956   K 4.4 02/26/2020 1146   K 4.3 10/08/2015 0956   CL 104 02/26/2020 1146   CL 107 08/26/2012 0905   CO2 27 02/26/2020 1146   CO2 25 10/08/2015 0956   BUN 15 02/26/2020 1146   BUN 14.7 10/08/2015 0956   CREATININE 1.03 02/26/2020 1146   CREATININE 1.24 07/06/2018 0938   CREATININE 1.3 10/08/2015 0956      Component Value Date/Time   CALCIUM 9.8 02/26/2020 1146   CALCIUM 10.1 10/08/2015 0956   ALKPHOS 49 02/26/2020 1146   ALKPHOS 55 10/08/2015 0956   AST 19 02/26/2020 1146   AST 22 07/06/2018 0938   AST 24 10/08/2015 0956   ALT 13 02/26/2020 1146   ALT 22 07/06/2018 0938   ALT 26 10/08/2015 0956   BILITOT 0.4 02/26/2020 1146   BILITOT 0.4 07/06/2018 0938   BILITOT 0.62 10/08/2015 5512       46 year old man with:  Impression and plan:   1.  Neutropenia presented  in 2012 without any evidence of hematological disorder based on a bone marrow biopsy at that time.  He appears to have benign fluctuating findings without any recurrent infections.  The natural course of this disease was reviewed today with the patient and his parents.  Laboratory data from today continues to be consistent with his baseline.  He does not have any sign of recurrent infections and does not require any growth factor support.  I recommended annual monitoring.  2. Macrocytosis: Mild anemia noted but no other abnormalities detected at this time.   His work-up has been unrevealing in the past.   3. Follow-up: He will return in 1 year for repeat follow-up.  30 minutes were spent on this visit.  The time was dedicated to reviewing laboratory data, discussing differential diagnosis and management choices in the future.   Zola Button, MD 11/9/202310:21 AM

## 2022-03-13 DIAGNOSIS — H6122 Impacted cerumen, left ear: Secondary | ICD-10-CM | POA: Diagnosis not present

## 2022-03-13 DIAGNOSIS — H903 Sensorineural hearing loss, bilateral: Secondary | ICD-10-CM | POA: Diagnosis not present

## 2022-03-31 ENCOUNTER — Telehealth: Payer: Self-pay | Admitting: Oncology

## 2022-03-31 NOTE — Telephone Encounter (Signed)
Called patient regarding providers departure, spoke with patient's parents. Patient will be notified of 2024 appointment with their new provider.

## 2022-04-15 ENCOUNTER — Other Ambulatory Visit: Payer: Self-pay | Admitting: Internal Medicine

## 2022-04-27 ENCOUNTER — Other Ambulatory Visit: Payer: Self-pay | Admitting: Internal Medicine

## 2022-07-28 ENCOUNTER — Other Ambulatory Visit: Payer: Self-pay | Admitting: Internal Medicine

## 2022-08-24 ENCOUNTER — Ambulatory Visit (INDEPENDENT_AMBULATORY_CARE_PROVIDER_SITE_OTHER): Payer: Medicare Other

## 2022-08-24 VITALS — Ht 69.0 in | Wt 205.0 lb

## 2022-08-24 DIAGNOSIS — Z0000 Encounter for general adult medical examination without abnormal findings: Secondary | ICD-10-CM

## 2022-08-24 NOTE — Patient Instructions (Signed)
[name] , Thank you for taking time to come for your Medicare Wellness Visit. I appreciate your ongoing commitment to your health goals. Please review the following plan we discussed and let me know if I can assist you in the future.   These are the goals we discussed:  Goals      Patient declined health goal at this time.        This is a list of the screening recommended for you and due dates:  Health Maintenance  Topic Date Due   DTaP/Tdap/Td vaccine (2 - Tdap) 05/12/2020   COVID-19 Vaccine (4 - 2023-24 season) 12/26/2021   Flu Shot  11/26/2022   Medicare Annual Wellness Visit  08/24/2023   Colon Cancer Screening  08/13/2024   Hepatitis C Screening: USPSTF Recommendation to screen - Ages 18-79 yo.  Completed   HIV Screening  Completed   HPV Vaccine  Aged Out    Advanced directives: No  Conditions/risks identified: Yes  Next appointment: Follow up in one year for your annual wellness visit.  Preventive Care 40-64 Years, Male Preventive care refers to lifestyle choices and visits with your health care provider that can promote health and wellness. What does preventive care include? A yearly physical exam. This is also called an annual well check. Dental exams once or twice a year. Routine eye exams. Ask your health care provider how often you should have your eyes checked. Personal lifestyle choices, including: Daily care of your teeth and gums. Regular physical activity. Eating a healthy diet. Avoiding tobacco and drug use. Limiting alcohol use. Practicing safe sex. Taking low-dose aspirin every day starting at age 45. What happens during an annual well check? The services and screenings done by your health care provider during your annual well check will depend on your age, overall health, lifestyle risk factors, and family history of disease. Counseling  Your health care provider may ask you questions about your: Alcohol use. Tobacco use. Drug use. Emotional  well-being. Home and relationship well-being. Sexual activity. Eating habits. Work and work Astron[employer]. Screening  You may have the following tests or measurements: Height, weight, and BMI. Blood pressure. Lipid and cholesterol levels. These may be checked every 5 years, or more frequently if you are over 25 years old. Skin check. Lung cancer screening. You may have this screening every year starting at age 33 if you have a 30-pack-year history of smoking and currently smoke or have quit within the past 15 years. Fecal occult blood test (FOBT) of the stool. You may have this test every year starting at age 80. Flexible sigmoidoscopy or colonoscopy. You may have a sigmoidoscopy every 5 years or a colonoscopy every 10 years starting at age 36. Prostate cancer screening. Recommendations will vary depending on your family history and other risks. Hepatitis C blood test. Hepatitis B blood test. Sexually transmitted disease (STD) testing. Diabetes screening. This is done by checking your blood sugar (glucose) after you have not eaten for a while (fasting). You may have this done every 1-3 years. Discuss your test results, treatment options, and if necessary, the need for more tests with your health care provider. Vaccines  Your health care provider may recommend certain vaccines, such as: Influenza vaccine. This is recommended every year. Tetanus, diphtheria, and acellular pertussis (Tdap, Td) vaccine. You may need a Td booster every 10 years. Zoster vaccine. You may need this after age 19. Pneumococcal 13-valent conjugate (PCV13) vaccine. You may need this if you have certain conditions  and have not been vaccinated. Pneumococcal polysaccharide (PPSV23) vaccine. You may need one or two doses if you smoke cigarettes or if you have certain conditions. Talk to your health care provider about which screenings and vaccines you need and how often you need them. This information is not intended to  replace advice given to you by your health care provider. Make sure you discuss any questions you have with your health care provider. Document Released: 05/10/2015 Document Revised: 01/01/2016 Document Reviewed: 02/12/2015 Elsevier Interactive Patient Education  2017 [employer].  Fall Prevention in the Home Falls can cause injuries. They can happen to people of all ages. There are many things you can do to make your home safe and to help prevent falls. What can I do on the outside of my home? Regularly fix the edges of walkways and driveways and fix any cracks. Remove anything that might make you trip as you walk through a door, such as a raised step or threshold. Trim any bushes or trees on the path to your home. Use bright outdoor lighting. Clear any walking paths of anything that might make someone trip, such as rocks or tools. Regularly check to see if handrails are loose or broken. Make sure that both sides of any steps have handrails. Any raised decks and porches should have guardrails on the edges. Have any leaves, snow, or ice cleared regularly. Use sand or salt on walking paths during winter. Clean up any spills in your garage right away. This includes oil or grease spills. What can I do in the bathroom? Use night lights. Install grab bars by the toilet and in the tub and shower. Do not use towel bars as grab bars. Use non-skid mats or decals in the tub or shower. If you need to sit down in the shower, use a plastic, non-slip stool. Keep the floor dry. Clean up any water that spills on the floor as soon as it happens. Remove soap buildup in the tub or shower regularly. Attach bath mats securely with double-sided non-slip rug tape. Do not have throw rugs and other things on the floor that can make you trip. What can I do in the bedroom? Use night lights. Make sure that you have a light by your bed that is easy to reach. Do not use any sheets or blankets that are too big for  your bed. They should not hang down onto the floor. Have a firm chair that has side arms. You can use this for support while you get dressed. Do not have throw rugs and other things on the floor that can make you trip. What can I do in the kitchen? Clean up any spills right away. Avoid walking on wet floors. Keep items that you use a lot in easy-to-reach places. If you need to reach something above you, use a strong step stool that has a grab bar. Keep electrical cords out of the way. Do not use floor polish or wax that makes floors slippery. If you must use wax, use non-skid floor wax. Do not have throw rugs and other things on the floor that can make you trip. What can I do with my stairs? Do not leave any items on the stairs. Make sure that there are handrails on both sides of the stairs and use them. Fix handrails that are broken or loose. Make sure that handrails are as long as the stairways. Check any carpeting to make sure that it is firmly attached to the  stairs. Fix any carpet that is loose or worn. Avoid having throw rugs at the top or bottom of the stairs. If you do have throw rugs, attach them to the floor with carpet tape. Make sure that you have a light switch at the top of the stairs and the bottom of the stairs. If you do not have them, ask someone to add them for you. What else can I do to help prevent falls? Wear shoes that: Do not have high heels. Have rubber bottoms. Are comfortable and fit you well. Are closed at the toe. Do not wear sandals. If you use a stepladder: Make sure that it is fully opened. Do not climb a closed stepladder. Make sure that both sides of the stepladder are locked into place. Ask someone to hold it for you, if possible. Clearly mark and make sure that you can see: Any grab bars or handrails. First and last steps. Where the edge of each step is. Use tools that help you move around (mobility aids) if they are needed. These  include: Canes. Walkers. Scooters. Crutches. Turn on the lights when you go into a dark area. Replace any light bulbs as soon as they burn out. Set up your furniture so you have a clear path. Avoid moving your furniture around. If any of your floors are uneven, fix them. If there are any pets around you, be aware of where they are. Review your medicines with your doctor. Some medicines can make you feel dizzy. This can increase your chance of falling. Ask your doctor what other things that you can do to help prevent falls. This information is not intended to replace advice given to you by your health care provider. Make sure you discuss any questions you have with your health care provider. Document Released: 02/07/2009 Document Revised: 09/19/2015 Document Reviewed: 05/18/2014 Elsevier Interactive Patient Education  2017 [employer].

## 2022-08-24 NOTE — Progress Notes (Addendum)
I connected with  Eugene Mccoy on 08/24/22 by a audio enabled telemedicine application and verified that I am speaking with the correct person using two identifiers.  Patient Location: Home  Provider Location: Office/Clinic  I discussed the limitations of evaluation and management by telemedicine. The patient expressed understanding and agreed to proceed.  Subjective:   Eugene Mccoy is a 47 y.o. male who presents for Medicare Annual/Subsequent preventive examination.  Review of Systems     Cardiac Risk Factors include: dyslipidemia;male gender;sedentary lifestyle     Objective:    Today's Vitals   08/24/22 1541  Weight: 205 lb (93 kg)  Height: 5\' 9"  (1.753 m)  PainSc: 0-No pain   Body mass index is 30.27 kg/m.     08/24/2022    3:50 PM 08/21/2021    4:15 PM 08/08/2020   10:51 AM 07/06/2018    9:59 AM 10/01/2016    8:13 AM 07/07/2016    9:53 AM 10/08/2015   10:13 AM  Advanced Directives  Does Patient Have a Medical Advance Directive? No No No Yes Yes;No No No  Does patient want to make changes to medical advance directive?     No - Patient declined    Would patient like information on creating a medical advance directive? No - Patient declined No - Patient declined No - Patient declined  No - Patient declined  No - patient declined information    Current Medications (verified) Outpatient Encounter Medications as of 08/24/2022  Medication Sig   tamsulosin (FLOMAX) 0.4 MG CAPS capsule TAKE 1 CAPSULE BY MOUTH EVERY DAY   No facility-administered encounter medications on file as of 08/24/2022.    Allergies (verified) Patient has no known allergies.   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   Past Surgical History:  Procedure Laterality Date   leukocytopenia     TONSILLECTOMY     tubes in ears     at 64 months old   Family History  Problem Relation Age of Onset   Diabetes Other         1st degree relative   Colon cancer Neg Hx    Esophageal cancer Neg Hx    Rectal cancer Neg Hx    Stomach cancer Neg Hx    Social History   Socioeconomic History   Marital status: Single    Spouse name: Not on file   Number of children: Not on file   Years of education: Not on file   Highest education level: Not on file  Occupational History   Occupation: Food services    Employer: [employer]  Tobacco Use   Smoking status: Never   Smokeless tobacco: Never  Vaping Use   Vaping Use: Never used  Substance and Sexual Activity   Alcohol use: No   Drug use: No   Sexual activity: Not Currently  Other Topics Concern   Not on file  Social History Narrative   Regular exercise-no            Social Determinants of Health   Financial Resource Strain: Low Risk  (08/24/2022)   Overall Financial Resource Strain (CARDIA)    Difficulty of Paying Living Expenses: Not hard at all  Food Insecurity: No Food Insecurity (08/24/2022)   Hunger Vital Sign    Worried About Running Out of Food in the Last Year: Never true    Ran Out of  Food in the Last Year: Never true  Transportation Needs: No Transportation Needs (08/24/2022)   PRAPARE - Administrator, Civil Service (Medical): No    Lack of Transportation (Non-Medical): No  Physical Activity: Inactive (08/24/2022)   Exercise Vital Sign    Days of Exercise per Week: 0 days    Minutes of Exercise per Session: 0 min  Stress: No Stress Concern Present (08/24/2022)   [employer] of Occupational Health - Occupational Stress Questionnaire    Feeling of Stress : Not at all  Social Connections: Moderately Integrated (08/24/2022)   Social Connection and Isolation Panel [NHANES]    Frequency of Communication with Friends and Family: More than three times a week    Frequency of Social Gatherings with Friends and Family: More than three times a week    Attends Religious Services: More than 4 times per year    Active Member  of [employer] or Organizations: Yes    Attends Engineer, structural: More than 4 times per year    Marital Status: Never married    Tobacco Counseling Counseling given: Not Answered   Clinical Intake:  Pre-visit preparation completed: Yes  Pain : No/denies pain Pain Score: 0-No pain     BMI - recorded: 30.27 Nutritional Status: BMI > 30  Obese Nutritional Risks: None Diabetes: No  How often do you need to have someone help you when you read instructions, pamphlets, or other written materials from your doctor or pharmacy?: 1 - Never What is the last grade level you completed in school?: Parents taught son  Diabetic? No  Interpreter Needed?: No  Information entered by :: Susie Cassette, LPN.   Activities of Daily Living    08/24/2022    3:52 PM  In your present state of health, do you have any difficulty performing the following activities:  Hearing? 1  Vision? 0  Difficulty concentrating or making decisions? 1  Walking or climbing stairs? 0  Dressing or bathing? 0  Doing errands, shopping? 1  Preparing Food and eating ? N  Using the Toilet? N  In the past six months, have you accidently leaked urine? N  Do you have problems with loss of bowel control? N  Managing your Medications? N  Managing your Finances? Y  Comment parents handle finances  Housekeeping or managing your Housekeeping? N    Patient Care Team: Etta Grandchild, MD as PCP - General  Indicate any recent Medical Services you may have received from other than [facility] providers in the past year (date may be approximate).     Assessment:   This is a routine wellness examination for [place].  Hearing/Vision screen Hearing Screening - Comments:: Patient wears hearing aids. Vision Screening - Comments:: Does not wear any glasses.   Dietary issues and exercise activities discussed: Current Exercise Habits: The patient does not participate in regular exercise at present, Exercise limited by:  None identified   Goals Addressed             This Visit's Progress    Patient declined health goal at this time.        Depression Screen    08/24/2022    3:49 PM 08/21/2021    4:13 PM 08/08/2020   10:51 AM 02/22/2019    9:46 AM 10/01/2016    8:15 AM 09/30/2016   11:10 AM 07/20/2015   10:27 AM  PHQ 2/9 Scores  PHQ - 2 Score 0 0 0  0 0 0  PHQ- 9 Score 0     0   Exception Documentation    Medical reason       Fall Risk    08/24/2022    3:52 PM 08/21/2021    4:16 PM 08/08/2020   10:51 AM 02/22/2019    9:46 AM 10/01/2016    8:15 AM  Fall Risk   Falls in the past year? 0 0 0 0 No  Number falls in past yr: 0 0 0 0   Injury with Fall? 0 0 0 0   Risk for fall due to : No Fall Risks No Fall Risks No Fall Risks    Follow up Falls prevention discussed Falls evaluation completed Falls evaluation completed Falls evaluation completed     FALL RISK PREVENTION PERTAINING TO THE HOME:  Any stairs in or around the home? Yes  to basement If so, are there any without handrails? No  Home free of loose throw rugs in walkways, pet beds, electrical cords, etc? Yes  Adequate lighting in your home to reduce risk of falls? Yes   ASSISTIVE DEVICES UTILIZED TO PREVENT FALLS:  Life alert? No  Use of a cane, walker or w/c? No  Grab bars in the bathroom? Yes  Shower chair or bench in shower? Yes  Elevated toilet seat or a handicapped toilet? No   TIMED UP AND GO:  Was the test performed? No . Telephonic Visit  Cognitive Function:    08/24/2022    3:54 PM 08/21/2021    4:17 PM 08/08/2020   10:53 AM  MMSE - Mini Mental State Exam  Not completed: Unable to complete Unable to complete Unable to complete        Immunizations Immunization History  Administered Date(s) Administered   Influenza,inj,Quad PF,6+ Mos 02/08/2014, 02/22/2019, 02/26/2020, 02/27/2021   PFIZER(Purple Top)SARS-COV-2 Vaccination 07/01/2019, 07/22/2019, 03/01/2020   Td 05/12/2010    TDAP status: Due, Education has  been provided regarding the importance of this vaccine. Advised may receive this vaccine at local pharmacy or Health Dept. Aware to provide a copy of the vaccination record if obtained from local pharmacy or Health Dept. Verbalized acceptance and understanding.  Flu Vaccine status: Due, Education has been provided regarding the importance of this vaccine. Advised may receive this vaccine at local pharmacy or Health Dept. Aware to provide a copy of the vaccination record if obtained from local pharmacy or Health Dept. Verbalized acceptance and understanding.  Pneumococcal vaccine status: Not of age  Covid-82 vaccine status: Completed vaccines  Qualifies for Shingles Vaccine? No   Zostavax completed No   Shingrix Completed?: No.    Education has been provided regarding the importance of this vaccine. Patient has been advised to call insurance company to determine out of pocket expense if they have not yet received this vaccine. Advised may also receive vaccine at local pharmacy or Health Dept. Verbalized acceptance and understanding.  Screening Tests Health Maintenance  Topic Date Due   DTaP/Tdap/Td (2 - Tdap) 05/12/2020   COVID-19 Vaccine (4 - 2023-24 season) 12/26/2021   INFLUENZA VACCINE  11/26/2022   Medicare Annual Wellness (AWV)  08/24/2023   COLONOSCOPY (Pts 45-72yrs Insurance coverage will need to be confirmed)  08/13/2024   Hepatitis C Screening  Completed   HIV Screening  Completed   HPV VACCINES  Aged Out    Health Maintenance  Health Maintenance Due  Topic Date Due   DTaP/Tdap/Td (2 - Tdap) 05/12/2020   COVID-19 Vaccine (4 - 2023-24 season) 12/26/2021  Colorectal cancer screening: Type of screening: Colonoscopy. Completed 08/13/2021. Repeat every 3 years  Lung Cancer Screening: (Low Dose CT Chest recommended if Age 86-80 years, 30 pack-year currently smoking OR have quit w/in 15years.) does not qualify.   Lung Cancer Screening Referral: No  Additional  Screening:  Hepatitis C Screening: does  qualify; Completed: 10/05/2012 Vision Screening: Recommended annual ophthalmology exams for early detection of glaucoma and other disorders of the eye. Is the patient up to date with their annual eye exam?  No  Who is the provider or what is the name of the office in which the patient attends annual eye exams? None at this time. If pt is not established with a provider, would they like to be referred to a provider to establish care? No .   Dental Screening: Recommended annual dental exams for proper oral hygiene  Community Resource Referral / Chronic Care Management: CRR required this visit?  No   CCM required this visit?  No      Plan:     I have personally reviewed and noted the following in the patient's chart:   Medical and social history Use of alcohol, tobacco or illicit drugs  Current medications and supplements including opioid prescriptions. Patient is not currently taking opioid prescriptions. Functional ability and status Nutritional status Physical activity Advanced directives List of other physicians Hospitalizations, surgeries, and ER visits in previous 12 months Vitals Screenings to include cognitive, depression, and falls Referrals and appointments  In addition, I have reviewed and discussed with patient certain preventive protocols, quality metrics, and best practice recommendations. A written personalized care plan for preventive services as well as general preventive health recommendations were provided to patient.     Mickeal Needy, LPN   12/05/9145   Nurse Notes:  Patient is not cogitatively intact. There were no vitals filed for this visit.

## 2022-12-09 ENCOUNTER — Emergency Department (HOSPITAL_COMMUNITY): Payer: Medicare Other

## 2022-12-09 ENCOUNTER — Emergency Department (HOSPITAL_COMMUNITY)
Admission: EM | Admit: 2022-12-09 | Discharge: 2022-12-09 | Disposition: A | Discharge status: Home / Self Care | Payer: Medicare Other | Attending: Emergency Medicine | Admitting: Emergency Medicine

## 2022-12-09 ENCOUNTER — Other Ambulatory Visit: Payer: Self-pay

## 2022-12-09 ENCOUNTER — Emergency Department (HOSPITAL_BASED_OUTPATIENT_CLINIC_OR_DEPARTMENT_OTHER): Payer: Medicare Other

## 2022-12-09 ENCOUNTER — Encounter (HOSPITAL_COMMUNITY): Payer: Self-pay

## 2022-12-09 DIAGNOSIS — R0602 Shortness of breath: Secondary | ICD-10-CM | POA: Diagnosis not present

## 2022-12-09 DIAGNOSIS — M7989 Other specified soft tissue disorders: Secondary | ICD-10-CM | POA: Insufficient documentation

## 2022-12-09 DIAGNOSIS — T7840XA Allergy, unspecified, initial encounter: Secondary | ICD-10-CM | POA: Diagnosis not present

## 2022-12-09 DIAGNOSIS — R062 Wheezing: Secondary | ICD-10-CM | POA: Insufficient documentation

## 2022-12-09 DIAGNOSIS — D61818 Other pancytopenia: Secondary | ICD-10-CM | POA: Insufficient documentation

## 2022-12-09 DIAGNOSIS — R944 Abnormal results of kidney function studies: Secondary | ICD-10-CM | POA: Insufficient documentation

## 2022-12-09 DIAGNOSIS — R600 Localized edema: Secondary | ICD-10-CM | POA: Diagnosis present

## 2022-12-09 LAB — CBC WITH DIFFERENTIAL/PLATELET
Abs Immature Granulocytes: 0.01 10*3/uL (ref 0.00–0.07)
Basophils Absolute: 0 10*3/uL (ref 0.0–0.1)
Basophils Relative: 0 %
Eosinophils Absolute: 0 10*3/uL (ref 0.0–0.5)
Eosinophils Relative: 0 %
HCT: 38.9 % — ABNORMAL LOW (ref 39.0–52.0)
Hemoglobin: 13.8 g/dL (ref 13.0–17.0)
Immature Granulocytes: 0 %
Lymphocytes Relative: 26 %
Lymphs Abs: 0.6 10*3/uL — ABNORMAL LOW (ref 0.7–4.0)
MCH: 36.6 pg — ABNORMAL HIGH (ref 26.0–34.0)
MCHC: 35.5 g/dL (ref 30.0–36.0)
MCV: 103.2 fL — ABNORMAL HIGH (ref 80.0–100.0)
Monocytes Absolute: 0.1 10*3/uL (ref 0.1–1.0)
Monocytes Relative: 5 %
Neutro Abs: 1.5 10*3/uL — ABNORMAL LOW (ref 1.7–7.7)
Neutrophils Relative %: 69 %
Platelets: 143 10*3/uL — ABNORMAL LOW (ref 150–400)
RBC: 3.77 MIL/uL — ABNORMAL LOW (ref 4.22–5.81)
RDW: 14.6 % (ref 11.5–15.5)
WBC: 2.3 10*3/uL — ABNORMAL LOW (ref 4.0–10.5)
nRBC: 0 % (ref 0.0–0.2)

## 2022-12-09 LAB — COMPREHENSIVE METABOLIC PANEL
ALT: 33 U/L (ref 0–44)
AST: 31 U/L (ref 15–41)
Albumin: 4.5 g/dL (ref 3.5–5.0)
Alkaline Phosphatase: 57 U/L (ref 38–126)
Anion gap: 11 (ref 5–15)
BUN: 18 mg/dL (ref 6–20)
CO2: 20 mmol/L — ABNORMAL LOW (ref 22–32)
Calcium: 9.7 mg/dL (ref 8.9–10.3)
Chloride: 108 mmol/L (ref 98–111)
Creatinine, Ser: 1.4 mg/dL — ABNORMAL HIGH (ref 0.61–1.24)
GFR, Estimated: >60 mL/min (ref >60)
Glucose, Bld: 118 mg/dL — ABNORMAL HIGH (ref 70–99)
Potassium: 3.5 mmol/L (ref 3.5–5.1)
Sodium: 139 mmol/L (ref 135–145)
Total Bilirubin: 0.8 mg/dL (ref 0.3–1.2)
Total Protein: 7.4 g/dL (ref 6.5–8.1)

## 2022-12-09 LAB — BRAIN NATRIURETIC PEPTIDE: B Natriuretic Peptide: 68 pg/mL (ref 0.0–100.0)

## 2022-12-09 MED ORDER — CETIRIZINE HCL 5 MG/5ML PO SOLN
10.0000 mg | Freq: Once | ORAL | Status: AC
  Administered 2022-12-09: 10 mg via ORAL
  Filled 2022-12-09: qty 10

## 2022-12-09 MED ORDER — METHYLPREDNISOLONE SODIUM SUCC 125 MG IJ SOLR
125.0000 mg | Freq: Once | INTRAMUSCULAR | Status: AC
  Administered 2022-12-09: 125 mg via INTRAVENOUS
  Filled 2022-12-09: qty 2

## 2022-12-09 MED ORDER — IPRATROPIUM BROMIDE 0.02 % IN SOLN
0.5000 mg | Freq: Once | RESPIRATORY_TRACT | Status: AC
  Administered 2022-12-09: 0.5 mg via RESPIRATORY_TRACT
  Filled 2022-12-09: qty 2.5

## 2022-12-09 MED ORDER — ALBUTEROL SULFATE (2.5 MG/3ML) 0.083% IN NEBU
5.0000 mg | INHALATION_SOLUTION | Freq: Once | RESPIRATORY_TRACT | Status: AC
  Administered 2022-12-09: 5 mg via RESPIRATORY_TRACT
  Filled 2022-12-09: qty 6

## 2022-12-09 NOTE — Discharge Instructions (Signed)
His kidney function is a little off and will need to be followed by his Doctor.  Watch for further signs of swelling or difficulty breathing.

## 2022-12-09 NOTE — ED Triage Notes (Signed)
Pt coming in today of bilateral eye swelling. Pt does sound as if he is wheezing, pts mother states that he has not complained of any SOB. Denies any new medications, reddness, or rashes. Has not given pt any mediations before arrival.

## 2022-12-09 NOTE — Progress Notes (Signed)
LLE venous duplex has been completed.  Preliminary results given to Dr. Rubin Payor.   Results can be found under chart review under CV PROC. 12/09/2022 10:48 AM  RVT, RDMS

## 2022-12-09 NOTE — ED Provider Notes (Signed)
[place] EMERGENCY DEPARTMENT AT [facility] Provider Note   CSN: [id number] Arrival date & time: 12/09/22  0801     History  Chief Complaint  Patient presents with   Facial Swelling    Eugene Mccoy is a 47 y.o. male.  HPI Patient presents with facial swelling weakness.  Does have some wheezing.  Does have a history of mental delay and not much history can complement.  History comes from mother.  Reportedly swelling today.  Woke up with it.  Had already eaten breakfast.  No known allergies.   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 Authorizing Provider  tamsulosin (FLOMAX) 0.4 MG CAPS capsule TAKE 1 CAPSULE BY MOUTH EVERY DAY 04/27/22   Etta Grandchild, MD      Allergies    Patient has no known allergies.    Review of Systems   Review of Systems  Physical Exam Updated Vital Signs BP (!) 129/91   Pulse 69   Temp 97.6 F (36.4 C) (Oral)   Resp 15   Ht 5\' 10"  (1.778 m)   Wt 90.7 kg   SpO2 99%   BMI 28.70 kg/m  Physical Exam Vitals reviewed.  HENT:     Head:     Comments: Periorbital edema bilaterally.  No eye drainage. Eyes:     Pupils: Pupils are equal, round, and reactive to light.  Cardiovascular:     Rate and Rhythm: Regular rhythm.  Pulmonary:     Breath sounds: Wheezing present.  Abdominal:     Tenderness: There is no abdominal tenderness.  Musculoskeletal:     Cervical back: Neck supple.     Comments: Edema of left lower leg greater than right.  Skin:    General: Skin is warm.     Comments: Has apparent hives on bilateral arms.  Also some redness on chest.  Neurological:     Mental Status: He is alert. Mental status is at baseline.     ED Results / Procedures / Treatments   Labs (all labs ordered are listed, but only abnormal results are displayed) Labs Reviewed   COMPREHENSIVE METABOLIC PANEL - Abnormal; Notable for the following components:      Result Value   CO2 20 (*)    Glucose, Bld 118 (*)    Creatinine, Ser 1.40 (*)    All other components within normal limits  CBC WITH DIFFERENTIAL/PLATELET - Abnormal; Notable for the following components:   WBC 2.3 (*)    RBC 3.77 (*)    HCT 38.9 (*)    MCV 103.2 (*)    MCH 36.6 (*)    Platelets 143 (*)    Neutro Abs 1.5 (*)    Lymphs Abs 0.6 (*)    All other components within normal limits  BRAIN NATRIURETIC PEPTIDE    EKG EKG Interpretation Date/Time:  Wednesday December 09 2022 08:27:24 EDT Ventricular Rate:  73 PR Interval:  205 QRS Duration:  85 QT Interval:  409 QTC Calculation: 451 R Axis:   70  Text Interpretation: duplicate Confirmed by Benjiman Core [id number]) on 12/09/2022 9:01:49 AM  Radiology VAS [place] LOWER EXTREMITY VENOUS (DVT) (ONLY [facility] & [facility])  Result Date: 12/09/2022  Lower Venous DVT Study Patient Name:  Eugene Mccoy  Date of Exam:   12/09/2022 Medical  Rec #: [id number]        Accession #:    [id number] Date of Birth: [birth year]         Patient Gender: M Patient Age:   45 years Exam Location:  [facility] Procedure:      VAS [place] LOWER EXTREMITY VENOUS (DVT) Referring Phys: Benjiman Core --------------------------------------------------------------------------------  Indications: Swelling.  Comparison Study: No previous exams Performing Technologist: Jody Hill RVT, RDMS  Examination Guidelines: A complete evaluation includes B-mode imaging, spectral Doppler, color Doppler, and power Doppler as needed of all accessible portions of each vessel. Bilateral testing is considered an integral part of a complete examination. Limited examinations for reoccurring indications may be performed as noted. The reflux portion of the exam is performed with the patient in reverse Trendelenburg.  +-----+---------------+---------+-----------+----------+--------------+  RIGHTCompressibilityPhasicitySpontaneityPropertiesThrombus Aging +-----+---------------+---------+-----------+----------+--------------+ CFV  Full           Yes      Yes                                 +-----+---------------+---------+-----------+----------+--------------+   +---------+---------------+---------+-----------+----------+--------------+ LEFT     CompressibilityPhasicitySpontaneityPropertiesThrombus Aging +---------+---------------+---------+-----------+----------+--------------+ CFV      Full           Yes      Yes                                 +---------+---------------+---------+-----------+----------+--------------+ SFJ      Full                                                        +---------+---------------+---------+-----------+----------+--------------+ FV Prox  Full           Yes      Yes                                 +---------+---------------+---------+-----------+----------+--------------+ FV Mid   Full           Yes      Yes                                 +---------+---------------+---------+-----------+----------+--------------+ FV DistalFull           Yes      Yes                                 +---------+---------------+---------+-----------+----------+--------------+ PFV      Full                                                        +---------+---------------+---------+-----------+----------+--------------+ POP      Full           Yes      Yes                                 +---------+---------------+---------+-----------+----------+--------------+  PTV      Full                                                        +---------+---------------+---------+-----------+----------+--------------+ PERO     Full                                                        +---------+---------------+---------+-----------+----------+--------------+     Summary: RIGHT: - No evidence of common femoral vein  obstruction.  LEFT: - There is no evidence of deep vein thrombosis in the lower extremity.  - No cystic structure found in the popliteal fossa.  *See table(s) above for measurements and observations.    Preliminary    DG Chest Portable 1 View  Result Date: 12/09/2022 CLINICAL DATA:  sob EXAM: PORTABLE CHEST 1 VIEW COMPARISON:  None Available. FINDINGS: No pleural effusion. No pneumothorax. No focal airspace opacity. Normal cardiac and mediastinal contours. No radiographically apparent displaced rib fractures. Visualized upper abdomen is unremarkable. IMPRESSION: No focal airspace opacity Electronically Signed   By: Lorenza Cambridge M.D.   On: 12/09/2022 09:03    Procedures Procedures    Medications Ordered in ED Medications  albuterol (PROVENTIL) (2.5 MG/3ML) 0.083% nebulizer solution 5 mg (5 mg Nebulization Given 12/09/22 0853)  ipratropium (ATROVENT) nebulizer solution 0.5 mg (0.5 mg Nebulization Given 12/09/22 0918)  methylPREDNISolone sodium succinate (SOLU-MEDROL) 125 mg/2 mL injection 125 mg (125 mg Intravenous Given 12/09/22 0850)  cetirizine HCl (Zyrtec) 5 MG/5ML solution 10 mg (10 mg Oral Given 12/09/22 0919)    ED Course/ Medical Decision Making/ A&P                                 Medical Decision Making Amount and/or Complexity of Data Reviewed Labs: ordered. Radiology: ordered.  Risk Prescription drug management.   Patient with facial swelling.  Began this morning.  Also some wheezes.  Increased swelling on left leg also.  Given steroids and Zyrtec.  Doing better after treatment.  Swelling is gone down.  Hives resolved.  Will need outpatient follow-up.  Creatinine also mildly elevated.  Of the last checked around 2 to 3 years ago.  Chest x-ray reassuring.  Has chronic pancytopenia.  Appears stable for discharge home with outpatient follow-up.  Discussed with patient's mother.         Final Clinical Impression(s) / ED Diagnoses Final diagnoses:  Allergic reaction,  initial encounter    Rx / DC Orders ED Discharge Orders     None         Benjiman Core, MD 12/09/22 1133

## 2022-12-16 ENCOUNTER — Telehealth: Payer: Self-pay

## 2022-12-16 NOTE — Telephone Encounter (Signed)
Transition Care Management Follow-up Telephone Call Date of discharge and from where: Eugene Mccoy 8/14 How have you been since you were released from the hospital? Doing fine and back at work  Any questions or concerns? No   Items Reviewed: Did the pt receive and understand the discharge instructions provided? Yes Medications obtained and verified? No  Other? No  Any new allergies since your discharge? No  Dietary orders reviewed? No Do you have support at home? Yes    PCP Hospital f/u appt confirmed? No  Scheduled to see  on  @ . Specialist Hospital f/u appt confirmed? No  Scheduled to see  on  @ . Are transportation arrangements needed? No  If their condition worsens, is the pt aware to call PCP or go to the Emergency Dept.? Yes Was the patient provided with contact information for the PCP's office or ED? Yes Was to pt encouraged to call back with questions or concerns? Yes

## 2023-02-03 ENCOUNTER — Telehealth: Payer: Self-pay | Admitting: Internal Medicine

## 2023-02-03 NOTE — Telephone Encounter (Signed)
Patient mother dropped off document Access [place] form, to be filled out by provider. Patient mother requested to send it back via Call Patient mother to pick up within 7-days. Document is located in providers tray at front office.Please advise at [phone]

## 2023-02-04 NOTE — Telephone Encounter (Signed)
Forms given to PCP to review and sign.

## 2023-02-18 ENCOUNTER — Other Ambulatory Visit: Payer: Self-pay | Admitting: Physician Assistant

## 2023-02-18 DIAGNOSIS — D61818 Other pancytopenia: Secondary | ICD-10-CM

## 2023-02-19 ENCOUNTER — Inpatient Hospital Stay: Payer: Medicare Other | Admitting: Physician Assistant

## 2023-02-19 ENCOUNTER — Other Ambulatory Visit: Payer: Self-pay | Admitting: Physician Assistant

## 2023-02-19 ENCOUNTER — Inpatient Hospital Stay: Payer: Medicare Other | Attending: Physician Assistant

## 2023-02-19 DIAGNOSIS — D61818 Other pancytopenia: Secondary | ICD-10-CM

## 2023-02-26 ENCOUNTER — Ambulatory Visit: Payer: Medicare Other | Admitting: Physician Assistant

## 2023-02-26 ENCOUNTER — Other Ambulatory Visit: Payer: Medicare Other

## 2023-03-04 ENCOUNTER — Ambulatory Visit: Payer: Medicare Other | Admitting: Oncology

## 2023-03-04 ENCOUNTER — Other Ambulatory Visit: Payer: Medicare Other

## 2023-03-05 ENCOUNTER — Ambulatory Visit: Payer: Medicare Other | Admitting: Physician Assistant

## 2023-03-05 ENCOUNTER — Other Ambulatory Visit: Payer: Medicare Other

## 2023-03-18 ENCOUNTER — Ambulatory Visit (INDEPENDENT_AMBULATORY_CARE_PROVIDER_SITE_OTHER): Payer: Medicare Other | Admitting: Otolaryngology

## 2023-03-18 ENCOUNTER — Ambulatory Visit (INDEPENDENT_AMBULATORY_CARE_PROVIDER_SITE_OTHER): Payer: Medicare Other | Admitting: Audiology

## 2023-03-18 ENCOUNTER — Encounter (INDEPENDENT_AMBULATORY_CARE_PROVIDER_SITE_OTHER): Payer: Self-pay

## 2023-03-18 VITALS — Wt 213.0 lb

## 2023-03-18 DIAGNOSIS — H903 Sensorineural hearing loss, bilateral: Secondary | ICD-10-CM

## 2023-03-18 DIAGNOSIS — H6123 Impacted cerumen, bilateral: Secondary | ICD-10-CM | POA: Diagnosis not present

## 2023-03-18 NOTE — Progress Notes (Signed)
  [address] [place], [place] [address] [phone]  Audiological Evaluation    Name: Eugene Mccoy     DOB:   03-31-1976      MRN:   [id number]                                                                                     Service Date: 03/18/2023        Patient was referred today for a hearing evaluation by Dr. Karle Barr. He was previously seen at [facility] on 03/13/2022.   Tympanogram: Right ear: Large external ear canal volume with no middle ear pressure and tympanic membrane compliance (Type B). Left ear: Large external ear canal volume with no middle ear pressure and tympanic membrane compliance (Type B).    Hearing Evaluation: The audiogram was completed using conventional audiometric techniques under headphones with good reliability.   The hearing test results indicate: Right ear: Severe to profound sensorineural hearing loss from [phone] Hz. Left ear: Severe to profound sensorineural hearing loss from [phone] Hz.  Speech Audiometry and Word Recognition Testing were not completed at this time.    Impression:  There is not a significant difference between puretone thresholds between ears.   Recommendations: Repeat audiogram when changes are perceived or per MD.   Conley Rolls Charlotte Fidalgo, AUD, CCC-A 03/18/23

## 2023-03-18 NOTE — Progress Notes (Signed)
Patient ID: Eugene Mccoy, male   DOB: 06/12/1975, 47 y.o.   MRN: [id number]  Follow-up: Congenital hearing loss  HPI: The patient is a 47 year old male who returns today with his mother.  The patient was born with a neurologic disorder, including congenital hearing loss and intellectual disability.  He has been wearing bilateral hearing aids for most of his life.  The patient also has a history of recurrent cerumen impaction.  According to the mother, his hearing loss has progressively worsened.  Currently the patient has no obvious otalgia or otorrhea.  Exam: General: Communicates with significant difficulty, well nourished, no acute distress. Head: Normocephalic, no evidence injury, no tenderness, facial buttresses intact without stepoff. Face/sinus: No tenderness to palpation and percussion. Facial movement is normal and symmetric. Eyes: PERRL, EOMI. No scleral icterus, conjunctivae clear. Neuro: CN II exam reveals vision grossly intact.  No nystagmus at any point of gaze. EAC: Bilateral normal cerumen impaction.  Under the operating microscope, the cerumen is carefully removed with a combination of cerumen currette, alligator forceps, and suction catheters.  After the cerumen is removed, the TMs are noted to be thickened and opacified. Nose: External evaluation reveals normal support and skin without lesions.  Dorsum is intact.  Anterior rhinoscopy reveals pink mucosa over anterior aspect of inferior turbinates and intact septum.  No purulence noted. Oral:  Oral cavity and oropharynx are intact, symmetric, without erythema or edema.  Mucosa is moist without lesions. Neck: Full range of motion without pain.  There is no significant lymphadenopathy.  No masses palpable.  Thyroid bed within normal limits to palpation.  Parotid glands and submandibular glands equal bilaterally without mass.  Trachea is midline. Neuro:  CN 2-12 grossly intact. Gait wide-based.  Procedure: Bilateral cerumen  disimpaction Anesthesia: None Description: Under the operating microscope, the cerumen is carefully removed with a combination of cerumen currette, alligator forceps, and suction catheters.  After the cerumen is removed, the TMs are noted to be thickened and opacified.  No mass, erythema, or lesions. The patient tolerated the procedure well.    The hearing test shows bilateral severe sensorineural hearing loss.  Assessment: 1.  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 2.  Bilateral cerumen impaction.  After the disimpaction procedure, his tympanic membranes are noted to be thickened and opacified.  Plan: 1.  Otomicroscopy with bilateral cerumen disimpaction. 2.  The physical exam findings and the hearing test results are reviewed with the mother. 3.  Continue the use of his hearing aids. 4.  The patient will return for reevaluation in 6 months.

## 2023-03-24 ENCOUNTER — Encounter: Payer: Self-pay | Admitting: Internal Medicine

## 2023-03-24 ENCOUNTER — Ambulatory Visit: Payer: Medicare Other | Admitting: Internal Medicine

## 2023-03-24 ENCOUNTER — Ambulatory Visit (INDEPENDENT_AMBULATORY_CARE_PROVIDER_SITE_OTHER): Payer: Medicare Other

## 2023-03-24 VITALS — BP 116/66 | HR 81 | Temp 97.1°F | Ht 70.0 in | Wt 215.6 lb

## 2023-03-24 DIAGNOSIS — E785 Hyperlipidemia, unspecified: Secondary | ICD-10-CM

## 2023-03-24 DIAGNOSIS — R051 Acute cough: Secondary | ICD-10-CM | POA: Insufficient documentation

## 2023-03-24 DIAGNOSIS — Z23 Encounter for immunization: Secondary | ICD-10-CM | POA: Diagnosis not present

## 2023-03-24 DIAGNOSIS — Z125 Encounter for screening for malignant neoplasm of prostate: Secondary | ICD-10-CM

## 2023-03-24 DIAGNOSIS — Z0000 Encounter for general adult medical examination without abnormal findings: Secondary | ICD-10-CM | POA: Diagnosis not present

## 2023-03-24 DIAGNOSIS — D61818 Other pancytopenia: Secondary | ICD-10-CM

## 2023-03-24 DIAGNOSIS — Z0001 Encounter for general adult medical examination with abnormal findings: Secondary | ICD-10-CM | POA: Insufficient documentation

## 2023-03-24 DIAGNOSIS — R059 Cough, unspecified: Secondary | ICD-10-CM | POA: Diagnosis not present

## 2023-03-24 LAB — BASIC METABOLIC PANEL
BUN: 20 mg/dL (ref 6–23)
CO2: 26 meq/L (ref 19–32)
Calcium: 9.9 mg/dL (ref 8.4–10.5)
Chloride: 107 meq/L (ref 96–112)
Creatinine, Ser: 1.28 mg/dL (ref 0.40–1.50)
GFR: 65.81 mL/min (ref >60.00)
Glucose, Bld: 87 mg/dL (ref 70–99)
Potassium: 4.1 meq/L (ref 3.5–5.1)
Sodium: 140 meq/L (ref 135–145)

## 2023-03-24 LAB — CBC WITH DIFFERENTIAL/PLATELET
Basophils Absolute: 0.1 10*3/uL (ref 0.0–0.1)
Basophils Relative: 1.9 % (ref 0.0–3.0)
Eosinophils Absolute: 0.1 10*3/uL (ref 0.0–0.7)
Eosinophils Relative: 2.8 % (ref 0.0–5.0)
HCT: 36.6 % — ABNORMAL LOW (ref 39.0–52.0)
Hemoglobin: 12.7 g/dL — ABNORMAL LOW (ref 13.0–17.0)
Lymphocytes Relative: 47 % — ABNORMAL HIGH (ref 12.0–46.0)
Lymphs Abs: 1.2 10*3/uL (ref 0.7–4.0)
MCHC: 34.8 g/dL (ref 30.0–36.0)
MCV: 105.5 fL — ABNORMAL HIGH (ref 78.0–100.0)
Monocytes Absolute: 0.2 10*3/uL (ref 0.1–1.0)
Monocytes Relative: 8.5 % (ref 3.0–12.0)
Neutro Abs: 1 10*3/uL — ABNORMAL LOW (ref 1.4–7.7)
Neutrophils Relative %: 39.8 % — ABNORMAL LOW (ref 43.0–77.0)
Platelets: 167 10*3/uL (ref 150.0–400.0)
RBC: 3.47 Mil/uL — ABNORMAL LOW (ref 4.22–5.81)
RDW: 14 % (ref 11.5–15.5)
WBC: 2.6 10*3/uL — ABNORMAL LOW (ref 4.0–10.5)

## 2023-03-24 LAB — LIPID PANEL
Cholesterol: 201 mg/dL — ABNORMAL HIGH (ref 0–200)
HDL: 50.2 mg/dL (ref >39.00)
LDL Cholesterol: 101 mg/dL — ABNORMAL HIGH (ref 0–99)
NonHDL: 150.58
Total CHOL/HDL Ratio: 4
Triglycerides: 250 mg/dL — ABNORMAL HIGH (ref 0.0–149.0)
VLDL: 50 mg/dL — ABNORMAL HIGH (ref 0.0–40.0)

## 2023-03-24 LAB — HEPATIC FUNCTION PANEL
ALT: 20 U/L (ref 0–53)
AST: 20 U/L (ref 0–37)
Albumin: 4.8 g/dL (ref 3.5–5.2)
Alkaline Phosphatase: 67 U/L (ref 39–117)
Bilirubin, Direct: 0.1 mg/dL (ref 0.0–0.3)
Total Bilirubin: 0.3 mg/dL (ref 0.2–1.2)
Total Protein: 6.9 g/dL (ref 6.0–8.3)

## 2023-03-24 LAB — PSA: PSA: 0.01 ng/mL — ABNORMAL LOW (ref 0.10–4.00)

## 2023-03-24 LAB — TSH: TSH: 0.52 u[IU]/mL (ref 0.35–5.50)

## 2023-03-24 MED ORDER — BOOSTRIX 5-2.5-18.5 LF-MCG/0.5 IM SUSP
0.5000 mL | Freq: Once | INTRAMUSCULAR | 0 refills | Status: AC
Start: 2023-03-24 — End: 2023-03-24

## 2023-03-24 NOTE — Progress Notes (Signed)
Subjective:  Patient ID: Eugene Mccoy, male    DOB: 1975/10/28  Age: 47 y.o. MRN: [id number]  CC: Annual Exam, Cough, and Anemia   HPI [name] presents for a CPX and f/up -----  Discussed the use of AI scribe software for clinical note transcription with the patient, who gave verbal consent to proceed.  History of Present Illness   The patient, with a history of pancytopenia presents for an annual check-up and flu vaccination. Despite a low white cell count, which has been stable, he has not experienced frequent infections, fevers, chills, nausea, or vomiting.  The patient's appetite remains robust, leading to a weight of 251 pounds. He eats frequently, including late-night meals.   His mother has noticed a one-week history of NP cough.       Outpatient Medications Prior to Visit  Medication Sig Dispense Refill   tamsulosin (FLOMAX) 0.4 MG CAPS capsule TAKE 1 CAPSULE BY MOUTH EVERY DAY 90 capsule 0   No facility-administered medications prior to visit.    ROS Review of Systems  Constitutional:  Positive for unexpected weight change (wt gain). Negative for chills, fatigue and fever.  HENT: Negative.  Negative for sore throat.   Eyes: Negative.  Negative for visual disturbance.  Respiratory:  Positive for cough. Negative for chest tightness, shortness of breath and wheezing.   Cardiovascular:  Negative for chest pain, palpitations and leg swelling.  Gastrointestinal:  Negative for abdominal pain, constipation, diarrhea, nausea and vomiting.  Genitourinary: Negative.   Musculoskeletal: Negative.  Negative for arthralgias and myalgias.  Skin: Negative.   Neurological: Negative.  Negative for dizziness and weakness.  Hematological:  Negative for adenopathy. Does not bruise/bleed easily.  Psychiatric/Behavioral:  Positive for behavioral problems, confusion and decreased concentration. Negative for sleep disturbance and suicidal ideas. The patient is not nervous/anxious.      Objective:  BP 116/66 (BP Location: Left Arm, Patient Position: Sitting, Cuff Size: Normal)   Pulse 81   Temp (!) 97.1 F (36.2 C) (Oral)   Ht 5\' 10"  (1.778 m)   Wt 215 lb 9.6 oz (97.8 kg)   SpO2 99%   BMI 30.94 kg/m   BP Readings from Last 3 Encounters:  03/24/23 116/66  12/09/22 (!) 129/91  03/05/22 117/69    Wt Readings from Last 3 Encounters:  03/24/23 215 lb 9.6 oz (97.8 kg)  03/18/23 213 lb (96.6 kg)  12/09/22 200 lb (90.7 kg)    Physical Exam Vitals reviewed.  Constitutional:      Appearance: Normal appearance.  HENT:     Nose: Nose normal.     Mouth/Throat:     Mouth: Mucous membranes are moist.  Eyes:     General: No scleral icterus.    Conjunctiva/sclera: Conjunctivae normal.  Cardiovascular:     Rate and Rhythm: Normal rate and regular rhythm.     Heart sounds: No murmur heard.    No friction rub. No gallop.  Pulmonary:     Effort: Pulmonary effort is normal.     Breath sounds: No stridor. No wheezing, rhonchi or rales.  Abdominal:     General: Abdomen is flat.     Palpations: There is no mass.     Tenderness: There is no abdominal tenderness. There is no guarding.     Hernia: No hernia is present.  Musculoskeletal:        General: Normal range of motion.     Cervical back: Neck supple.     Right lower  leg: No edema.     Left lower leg: No edema.  Lymphadenopathy:     Cervical: No cervical adenopathy.  Skin:    General: Skin is warm and dry.  Neurological:     General: No focal deficit present.     Mental Status: He is alert. Mental status is at baseline.  Psychiatric:        Mood and Affect: Mood normal.        Behavior: Behavior normal.     Lab Results  Component Value Date   WBC 2.6 (L) 03/24/2023   HGB 12.7 (L) 03/24/2023   HCT 36.6 (L) 03/24/2023   PLT 167.0 03/24/2023   GLUCOSE 87 03/24/2023   CHOL 201 (H) 03/24/2023   TRIG 250.0 (H) 03/24/2023   HDL 50.20 03/24/2023   LDLCALC 101 (H) 03/24/2023   ALT 20 03/24/2023    AST 20 03/24/2023   NA 140 03/24/2023   K 4.1 03/24/2023   CL 107 03/24/2023   CREATININE 1.28 03/24/2023   BUN 20 03/24/2023   CO2 26 03/24/2023   TSH 0.52 03/24/2023   PSA 0.01 (L) 03/24/2023   INR 1.03 12/25/2010   HGBA1C 5.0 02/08/2014    VAS [place] LOWER EXTREMITY VENOUS (DVT) (ONLY [facility] & [facility])  Result Date: 12/10/2022  Lower Venous DVT Study Patient Name:  Eugene Mccoy  Date of Exam:   12/09/2022 Medical Rec #: [id number]        Accession #:    [id number] Date of Birth: Jan 30, 1976         Patient Gender: M Patient Age:   47 years Exam Location:  [facility] Procedure:      VAS [place] LOWER EXTREMITY VENOUS (DVT) Referring Phys: Benjiman Core --------------------------------------------------------------------------------  Indications: Swelling.  Comparison Study: No previous exams Performing Technologist: Jody Hill RVT, RDMS  Examination Guidelines: A complete evaluation includes B-mode imaging, spectral Doppler, color Doppler, and power Doppler as needed of all accessible portions of each vessel. Bilateral testing is considered an integral part of a complete examination. Limited examinations for reoccurring indications may be performed as noted. The reflux portion of the exam is performed with the patient in reverse Trendelenburg.  +-----+---------------+---------+-----------+----------+--------------+ RIGHTCompressibilityPhasicitySpontaneityPropertiesThrombus Aging +-----+---------------+---------+-----------+----------+--------------+ CFV  Full           Yes      Yes                                 +-----+---------------+---------+-----------+----------+--------------+   +---------+---------------+---------+-----------+----------+--------------+ LEFT     CompressibilityPhasicitySpontaneityPropertiesThrombus Aging +---------+---------------+---------+-----------+----------+--------------+ CFV      Full           Yes      Yes                                  +---------+---------------+---------+-----------+----------+--------------+ SFJ      Full                                                        +---------+---------------+---------+-----------+----------+--------------+ FV Prox  Full           Yes      Yes                                 +---------+---------------+---------+-----------+----------+--------------+  FV Mid   Full           Yes      Yes                                 +---------+---------------+---------+-----------+----------+--------------+ FV DistalFull           Yes      Yes                                 +---------+---------------+---------+-----------+----------+--------------+ PFV      Full                                                        +---------+---------------+---------+-----------+----------+--------------+ POP      Full           Yes      Yes                                 +---------+---------------+---------+-----------+----------+--------------+ PTV      Full                                                        +---------+---------------+---------+-----------+----------+--------------+ PERO     Full                                                        +---------+---------------+---------+-----------+----------+--------------+     Summary: RIGHT: - No evidence of common femoral vein obstruction.  LEFT: - There is no evidence of deep vein thrombosis in the lower extremity.  - No cystic structure found in the popliteal fossa.  *See table(s) above for measurements and observations. Electronically signed by Sherald Hess MD on 12/10/2022 at 9:41:41 AM.    Final    DG Chest Portable 1 View  Result Date: 12/09/2022 CLINICAL DATA:  sob EXAM: PORTABLE CHEST 1 VIEW COMPARISON:  None Available. FINDINGS: No pleural effusion. No pneumothorax. No focal airspace opacity. Normal cardiac and mediastinal contours. No radiographically apparent displaced rib fractures. Visualized  upper abdomen is unremarkable. IMPRESSION: No focal airspace opacity Electronically Signed   By: Lorenza Cambridge M.D.   On: 12/09/2022 09:03    DG Chest 2 View  Result Date: 03/24/2023 CLINICAL DATA:  Cough for 1 week. EXAM: CHEST - 2 VIEW COMPARISON:  December 09, 2022. FINDINGS: The heart size and mediastinal contours are within normal limits. Both lungs are clear. The visualized skeletal structures are unremarkable. IMPRESSION: No active cardiopulmonary disease. Electronically Signed   By: Lupita Raider M.D.   On: 03/24/2023 15:56     Assessment & Plan:   Hyperlipidemia with target LDL less than 130- Statin is not indicated. -     TSH; Future -     Hepatic function panel; Future -     Lipid panel; Future  Need  for influenza vaccination -     Flu vaccine trivalent PF, 6mos and older(Flulaval,Afluria,Fluarix,Fluzone)  Pancytopenia (HCC)- Stable. -     Basic metabolic panel; Future -     CBC with Differential/Platelet; Future -     TSH; Future -     Hepatic function panel; Future  Encounter for general adult medical examination with abnormal findings- Exam completed, labs reviewed, vaccines reviewed and updated, cancer screenings addressed, pt ed material was given.   Prostate cancer screening -     PSA; Future  Acute cough- CXR is negative for mass/infiltrate. -     DG Chest 2 View; Future  Need for prophylactic vaccination with combined diphtheria-tetanus-pertussis (DTP) vaccine -     Boostrix; Inject 0.5 mLs into the muscle once for 1 dose.  Dispense: 0.5 mL; Refill: 0     Follow-up: Return in about 6 months (around 09/21/2023).  Sanda Linger, MD

## 2023-03-24 NOTE — Patient Instructions (Signed)
Health Maintenance, Male Adopting a healthy lifestyle and getting preventive care are important in promoting health and wellness. Ask your health care provider about: The right schedule for you to have regular tests and exams. Things you can do on your own to prevent diseases and keep yourself healthy. What should I know about diet, weight, and exercise? Eat a healthy diet  Eat a diet that includes plenty of vegetables, fruits, low-fat dairy products, and lean protein. Do not eat a lot of foods that are high in solid fats, added sugars, or sodium. Maintain a healthy weight Body mass index (BMI) is a measurement that can be used to identify possible weight problems. It estimates body fat based on height and weight. Your health care provider can help determine your BMI and help you achieve or maintain a healthy weight. Get regular exercise Get regular exercise. This is one of the most important things you can do for your health. Most adults should: Exercise for at least 150 minutes each week. The exercise should increase your heart rate and make you sweat (moderate-intensity exercise). Do strengthening exercises at least twice a week. This is in addition to the moderate-intensity exercise. Spend less time sitting. Even light physical activity can be beneficial. Watch cholesterol and blood lipids Have your blood tested for lipids and cholesterol at 49 years of age, then have this test every 5 years. You may need to have your cholesterol levels checked more often if: Your lipid or cholesterol levels are high. You are older than 49 years of age. You are at high risk for heart disease. What should I know about cancer screening? Many types of cancers can be detected early and may often be prevented. Depending on your health history and family history, you may need to have cancer screening at various ages. This may include screening for: Colorectal cancer. Prostate cancer. Skin cancer. Lung  cancer. What should I know about heart disease, diabetes, and high blood pressure? Blood pressure and heart disease High blood pressure causes heart disease and increases the risk of stroke. This is more likely to develop in people who have high blood pressure readings or are overweight. Talk with your health care provider about your target blood pressure readings. Have your blood pressure checked: Every 3-5 years if you are 18-39 years of age. Every year if you are 40 years old or older. If you are between the ages of 65 and 75 and are a current or former smoker, ask your health care provider if you should have a one-time screening for abdominal aortic aneurysm (AAA). Diabetes Have regular diabetes screenings. This checks your fasting blood sugar level. Have the screening done: Once every three years after age 45 if you are at a normal weight and have a low risk for diabetes. More often and at a younger age if you are overweight or have a high risk for diabetes. What should I know about preventing infection? Hepatitis B If you have a higher risk for hepatitis B, you should be screened for this virus. Talk with your health care provider to find out if you are at risk for hepatitis B infection. Hepatitis C Blood testing is recommended for: Everyone born from 1945 through 1965. Anyone with known risk factors for hepatitis C. Sexually transmitted infections (STIs) You should be screened each year for STIs, including gonorrhea and chlamydia, if: You are sexually active and are younger than 49 years of age. You are older than 49 years of age and your   health care provider tells you that you are at risk for this type of infection. Your sexual activity has changed since you were last screened, and you are at increased risk for chlamydia or gonorrhea. Ask your health care provider if you are at risk. Ask your health care provider about whether you are at high risk for HIV. Your health care provider  may recommend a prescription medicine to help prevent HIV infection. If you choose to take medicine to prevent HIV, you should first get tested for HIV. You should then be tested every 3 months for as long as you are taking the medicine. Follow these instructions at home: Alcohol use Do not drink alcohol if your health care provider tells you not to drink. If you drink alcohol: Limit how much you have to 0-2 drinks a day. Know how much alcohol is in your drink. In the [place] one drink equals one 12 oz bottle of beer (355 mL), one 5 oz glass of wine (148 mL), or one 1 oz glass of hard liquor (44 mL). Lifestyle Do not use any products that contain nicotine or tobacco. These products include cigarettes, chewing tobacco, and vaping devices, such as e-cigarettes. If you need help quitting, ask your health care provider. Do not use street drugs. Do not share needles. Ask your health care provider for help if you need support or information about quitting drugs. General instructions Schedule regular health, dental, and eye exams. Stay current with your vaccines. Tell your health care provider if: You often feel depressed. You have ever been abused or do not feel safe at home. Summary Adopting a healthy lifestyle and getting preventive care are important in promoting health and wellness. Follow your health care provider's instructions about healthy diet, exercising, and getting tested or screened for diseases. Follow your health care provider's instructions on monitoring your cholesterol and blood pressure. This information is not intended to replace advice given to you by your health care provider. Make sure you discuss any questions you have with your health care provider. Document Revised: 09/02/2020 Document Reviewed: 09/02/2020 [employer] Patient Education  2024 [employer]  

## 2023-03-27 ENCOUNTER — Encounter: Payer: Self-pay | Admitting: Internal Medicine

## 2023-09-15 ENCOUNTER — Ambulatory Visit (INDEPENDENT_AMBULATORY_CARE_PROVIDER_SITE_OTHER): Payer: Medicare Other | Admitting: Otolaryngology

## 2023-09-21 ENCOUNTER — Ambulatory Visit: Payer: Medicare Other | Admitting: Internal Medicine

## 2023-09-28 ENCOUNTER — Ambulatory Visit (INDEPENDENT_AMBULATORY_CARE_PROVIDER_SITE_OTHER): Admitting: Otolaryngology

## 2023-10-13 ENCOUNTER — Ambulatory Visit: Admitting: Internal Medicine

## 2023-11-15 ENCOUNTER — Ambulatory Visit (INDEPENDENT_AMBULATORY_CARE_PROVIDER_SITE_OTHER): Admitting: Otolaryngology

## 2024-03-06 ENCOUNTER — Ambulatory Visit (INDEPENDENT_AMBULATORY_CARE_PROVIDER_SITE_OTHER): Admitting: Otolaryngology

## 2024-03-20 ENCOUNTER — Ambulatory Visit (INDEPENDENT_AMBULATORY_CARE_PROVIDER_SITE_OTHER): Admitting: Otolaryngology

## 2024-03-21 ENCOUNTER — Ambulatory Visit: Admitting: Internal Medicine
